# Patient Record
Sex: Female | Born: 1995 | Race: White | Hispanic: No | Marital: Single | Smoking: Current every day smoker
Health system: Southern US, Community
[De-identification: ages and names within clinical notes are randomized; demographics above are authoritative.]

## PROBLEM LIST (undated history)

## (undated) ENCOUNTER — Inpatient Hospital Stay (HOSPITAL_COMMUNITY): Payer: Self-pay

## (undated) DIAGNOSIS — F419 Anxiety disorder, unspecified: Secondary | ICD-10-CM

## (undated) DIAGNOSIS — J45909 Unspecified asthma, uncomplicated: Secondary | ICD-10-CM

## (undated) HISTORY — PX: NO PAST SURGERIES: SHX2092

---
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
## 2016-06-18 LAB — OB RESULTS CONSOLE HEPATITIS B SURFACE ANTIGEN: HEP B S AG: NEGATIVE

## 2016-07-09 ENCOUNTER — Ambulatory Visit (INDEPENDENT_AMBULATORY_CARE_PROVIDER_SITE_OTHER): Payer: Medicaid Other | Admitting: Obstetrics & Gynecology

## 2016-07-09 ENCOUNTER — Other Ambulatory Visit (HOSPITAL_COMMUNITY)
Admission: RE | Admit: 2016-07-09 | Discharge: 2016-07-09 | Disposition: A | Discharge status: Home / Self Care | Payer: Medicaid Other | Source: Ambulatory Visit | Attending: Obstetrics & Gynecology | Admitting: Obstetrics & Gynecology

## 2016-07-09 ENCOUNTER — Encounter: Payer: Self-pay | Admitting: Obstetrics & Gynecology

## 2016-07-09 DIAGNOSIS — Z3490 Encounter for supervision of normal pregnancy, unspecified, unspecified trimester: Secondary | ICD-10-CM | POA: Insufficient documentation

## 2016-07-09 DIAGNOSIS — Z113 Encounter for screening for infections with a predominantly sexual mode of transmission: Secondary | ICD-10-CM | POA: Insufficient documentation

## 2016-07-09 DIAGNOSIS — Z01419 Encounter for gynecological examination (general) (routine) without abnormal findings: Secondary | ICD-10-CM | POA: Insufficient documentation

## 2016-07-09 DIAGNOSIS — Z3400 Encounter for supervision of normal first pregnancy, unspecified trimester: Secondary | ICD-10-CM

## 2016-07-09 DIAGNOSIS — Z3402 Encounter for supervision of normal first pregnancy, second trimester: Secondary | ICD-10-CM | POA: Diagnosis not present

## 2016-07-09 DIAGNOSIS — Z3689 Encounter for other specified antenatal screening: Secondary | ICD-10-CM | POA: Diagnosis not present

## 2016-07-09 NOTE — Addendum Note (Signed)
Addended by: Natale MilchSTALLING, BRITTANY D on: 07/09/2016 05:18 PM   Modules accepted: Orders

## 2016-07-09 NOTE — Progress Notes (Signed)
  [place]ive[place]    Madison Wilkinson is a G1P0 5735w2d being seen today for her first obstetrical visit.  Her obstetrical history is significant for first pregnancy. Patient does intend to breast feed. Pregnancy history fully reviewed.  Patient reports nausea.  Vitals:   07/09/16 1549 07/09/16 1553  BP: 115/78   Pulse: 99   Temp: 98 F (36.7 C)   Weight: 202 lb 12.8 oz (92 kg)   Height:  5\' 3"  (1.6 m)    HISTORY: OB History  Gravida Para Term Preterm AB Living  1            SAB TAB Ectopic Multiple Live Births               # Outcome Date GA Lbr Len/2nd Weight Sex Delivery Anes PTL Lv  1 Current              History reviewed. No pertinent past medical history. History reviewed. No pertinent surgical history. Family History  Problem Relation Age of Onset  . Cancer Paternal Grandfather   . Diabetes Paternal Grandfather   . Heart attack Paternal Grandfather      Exam    Uterus:     Pelvic Exam:    Perineum: No Hemorrhoids   Vulva: normal   Vagina:  normal mucosa   pH:     Cervix: no cervical motion tenderness and no lesions   Adnexa: normal adnexa   Bony Pelvis: average  System: Breast:  normal appearance, no masses or tenderness   Skin: normal coloration and turgor, no rashes    Neurologic: oriented, normal mood   Extremities: normal strength, tone, and muscle mass   HEENT thyroid without masses   Mouth/Teeth mucous membranes moist, pharynx normal without lesions and dental hygiene good   Neck supple   Cardiovascular: regular rate and rhythm, no murmurs or gallops   Respiratory:  appears well, vitals normal, no respiratory distress, acyanotic, normal RR, neck free of mass or lymphadenopathy, chest clear, no wheezing, crepitations, rhonchi, normal symmetric air entry   Abdomen: soft, non-tender; bowel sounds normal; no masses,  no organomegaly   Urinary: urethral meatus normal      Assessment:    Pregnancy: G1P0 Patient Active  Problem List   Diagnosis Date Noted  . Supervision of normal pregnancy, antepartum 07/09/2016        Plan:     Initial labs drawn. Prenatal vitamins. Problem list reviewed and updated. Genetic Screening discussed dating [place] scheduled, S<D, bedside us by me today CRL 4.76 cm + FHM, c/w 12 weeks.  Ultrasound discussed; fetal survey: 18-20 weeks.  Follow up in 4 weeks. 50% of 30 min visit spent on counseling and coordination of care.  [facility] [facility]   Shravya Wickwire 07/09/2016

## 2016-07-09 NOTE — Progress Notes (Signed)
Patient states that she is feeling good today.

## 2016-07-11 LAB — CYTOLOGY - PAP
CHLAMYDIA, DNA PROBE: NEGATIVE
Diagnosis: NEGATIVE
NEISSERIA GONORRHEA: NEGATIVE

## 2016-07-16 ENCOUNTER — Ambulatory Visit (INDEPENDENT_AMBULATORY_CARE_PROVIDER_SITE_OTHER): Payer: Medicaid Other

## 2016-07-16 DIAGNOSIS — Z3400 Encounter for supervision of normal first pregnancy, unspecified trimester: Secondary | ICD-10-CM

## 2016-07-16 DIAGNOSIS — Z3402 Encounter for supervision of normal first pregnancy, second trimester: Secondary | ICD-10-CM | POA: Diagnosis not present

## 2016-07-23 ENCOUNTER — Encounter (HOSPITAL_COMMUNITY): Payer: Self-pay | Admitting: Emergency Medicine

## 2016-07-23 ENCOUNTER — Emergency Department (HOSPITAL_COMMUNITY)
Admission: EM | Admit: 2016-07-23 | Discharge: 2016-07-23 | Disposition: A | Discharge status: Home / Self Care | Payer: Medicaid Other | Attending: Emergency Medicine | Admitting: Emergency Medicine

## 2016-07-23 ENCOUNTER — Telehealth: Payer: Self-pay | Admitting: *Deleted

## 2016-07-23 DIAGNOSIS — O99611 Diseases of the digestive system complicating pregnancy, first trimester: Secondary | ICD-10-CM | POA: Insufficient documentation

## 2016-07-23 DIAGNOSIS — Z87891 Personal history of nicotine dependence: Secondary | ICD-10-CM | POA: Insufficient documentation

## 2016-07-23 DIAGNOSIS — Z3A13 13 weeks gestation of pregnancy: Secondary | ICD-10-CM | POA: Diagnosis not present

## 2016-07-23 DIAGNOSIS — O0281 Inappropriate change in quantitative human chorionic gonadotropin (hCG) in early pregnancy: Secondary | ICD-10-CM | POA: Insufficient documentation

## 2016-07-23 DIAGNOSIS — K5900 Constipation, unspecified: Secondary | ICD-10-CM | POA: Diagnosis not present

## 2016-07-23 LAB — COMPREHENSIVE METABOLIC PANEL
ALT: 15 U/L (ref 14–54)
ANION GAP: 7 (ref 5–15)
AST: 17 U/L (ref 15–41)
Albumin: 3.8 g/dL (ref 3.5–5.0)
Alkaline Phosphatase: 49 U/L (ref 38–126)
BILIRUBIN TOTAL: 0.4 mg/dL (ref 0.3–1.2)
BUN: <5 mg/dL — ABNORMAL LOW (ref 6–20)
CO2: 22 mmol/L (ref 22–32)
Calcium: 9.2 mg/dL (ref 8.9–10.3)
Chloride: 106 mmol/L (ref 101–111)
Creatinine, Ser: 0.59 mg/dL (ref 0.44–1.00)
GFR calc Af Amer: >60 mL/min (ref >60)
Glucose, Bld: 106 mg/dL — ABNORMAL HIGH (ref 65–99)
POTASSIUM: 3.6 mmol/L (ref 3.5–5.1)
Sodium: 135 mmol/L (ref 135–145)
TOTAL PROTEIN: 6.8 g/dL (ref 6.5–8.1)

## 2016-07-23 LAB — URINE MICROSCOPIC-ADD ON: RBC / HPF: NONE SEEN RBC/hpf (ref 0–5)

## 2016-07-23 LAB — CBC
HEMATOCRIT: 38.6 % (ref 36.0–46.0)
HEMOGLOBIN: 13.1 g/dL (ref 12.0–15.0)
MCH: 29.1 pg (ref 26.0–34.0)
MCHC: 33.9 g/dL (ref 30.0–36.0)
MCV: 85.8 fL (ref 78.0–100.0)
Platelets: 249 10*3/uL (ref 150–400)
RBC: 4.5 MIL/uL (ref 3.87–5.11)
RDW: 12 % (ref 11.5–15.5)
WBC: 8.5 10*3/uL (ref 4.0–10.5)

## 2016-07-23 LAB — URINALYSIS, ROUTINE W REFLEX MICROSCOPIC
BILIRUBIN URINE: NEGATIVE
Glucose, UA: NEGATIVE mg/dL
Hgb urine dipstick: NEGATIVE
Ketones, ur: NEGATIVE mg/dL
NITRITE: NEGATIVE
PROTEIN: NEGATIVE mg/dL
SPECIFIC GRAVITY, URINE: 1.004 — AB (ref 1.005–1.030)
pH: 6 (ref 5.0–8.0)

## 2016-07-23 LAB — HCG, QUANTITATIVE, PREGNANCY: HCG, BETA CHAIN, QUANT, S: 60939 m[IU]/mL — AB (ref <5)

## 2016-07-23 LAB — LIPASE, BLOOD: Lipase: 20 U/L (ref 11–51)

## 2016-07-23 MED ORDER — SENNOSIDES-DOCUSATE SODIUM 8.6-50 MG PO TABS: 1.0000 | ORAL_TABLET | Freq: Every evening | ORAL | 0 refills | Status: DC | PRN

## 2016-07-23 NOTE — ED Triage Notes (Signed)
Pt reports being [redacted] weeks pregnant and waking up at 3am while sharp "all over abd pain" that started and stopped. Pt having no pain at this time. First pregnancy. Pt denies any vaginal bleeding or discharge.

## 2016-07-23 NOTE — ED Notes (Signed)
Pt unable to sign for discharge papers due to computer not working.  Pt verbalizes understanding

## 2016-07-23 NOTE — Telephone Encounter (Signed)
Pt called to office stating that she is having a lot of stomach pain. Would like to know if she needs  appt [facility].  Attempt to return call.  LM on VM making pt aware that [facility] if she feels her pain needs urgent attention. If not, pt advised to call office in order to get more information regarding problem.

## 2016-07-23 NOTE — ED Provider Notes (Signed)
[facility]-EMERGENCY DEPT Provider Note   CSN: [id number] Arrival date & time: 07/23/16  1105     History   Chief Complaint Chief Complaint  Patient presents with  . Abdominal Pain    HPI Madison Wilkinson is a 20 y.o. female.  Pt said that she is [redacted] weeks pregnant and woke up around 0300 with severe, diffuse abdominal pain.  Pt said that her pain is gone now.  She has been constipated.  She has not had a bm in 4 days.  She took some prune juice last night, but that has not helped.  [facility]  She denies any vaginal bleeding or d/c.      History reviewed. No pertinent past medical history.  Patient Active Problem List   Diagnosis Date Noted  . Supervision of normal pregnancy, antepartum 07/09/2016    History reviewed. No pertinent surgical history.  OB History    Gravida Para Term Preterm AB Living   1             SAB TAB Ectopic Multiple Live Births                   Home Medications    Prior to Admission medications   Medication Sig Start Date End Date Taking? Authorizing Provider  senna-docusate (SENOKOT-S) 8.6-50 MG tablet Take 1 tablet by mouth at bedtime as needed for mild constipation. 07/23/16   Jacalyn LefevreJulie Aleksei Goodlin, MD    Family History Family History  Problem Relation Age of Onset  . Cancer Paternal Grandfather   . Diabetes Paternal Grandfather   . Heart attack Paternal Grandfather     Social History Social History  Substance Use Topics  . Smoking status: Former Smoker    Packs/day: 1.00  . Smokeless tobacco: Never Used  . Alcohol use No     Allergies   Patient has no known allergies.   Review of Systems Review of Systems  Gastrointestinal: Positive for abdominal pain and constipation.  All other systems reviewed and are negative.    Physical Exam Updated Vital Signs BP 120/72   Pulse 91   Temp 97.9 F (36.6 C) (Oral)   Resp 18   Ht 5\' 3"  (1.6 m)   Wt 202 lb (91.6 kg)   LMP 03/24/2016   SpO2 99%   BMI  35.78 kg/m   Physical Exam  Constitutional: She appears well-developed and well-nourished.  HENT:  Head: Normocephalic and atraumatic.  Right Ear: External ear normal.  Left Ear: External ear normal.  Nose: Nose normal.  Mouth/Throat: Oropharynx is clear and moist.  Eyes: Conjunctivae and EOM are normal. Pupils are equal, round, and reactive to light.  Neck: Normal range of motion. Neck supple.  Cardiovascular: Normal rate, regular rhythm, normal heart sounds and intact distal pulses.   Pulmonary/Chest: Effort normal and breath sounds normal.  Abdominal: Soft. Bowel sounds are decreased. There is no tenderness.  Musculoskeletal: Normal range of motion.  Neurological: She is alert.  Skin: Skin is warm.  Psychiatric: She has a normal mood and affect. Her behavior is normal. Judgment and thought content normal.  Nursing note and vitals reviewed.    ED Treatments / Results  Labs (all labs ordered are listed, but only abnormal results are displayed) Labs Reviewed  COMPREHENSIVE METABOLIC PANEL - Abnormal; Notable for the following:       Result Value   Glucose, Bld 106 (*)    BUN <5 (*)  All other components within normal limits  HCG, QUANTITATIVE, PREGNANCY - Abnormal; Notable for the following:    hCG, Beta Chain, Quant, S 60,939 (*)    All other components within normal limits  URINALYSIS, ROUTINE W REFLEX MICROSCOPIC (NOT AT [facility]) - Abnormal; Notable for the following:    APPearance CLOUDY (*)    Specific Gravity, Urine 1.004 (*)    Leukocytes, UA TRACE (*)    All other components within normal limits  URINE MICROSCOPIC-ADD ON - Abnormal; Notable for the following:    Squamous Epithelial / LPF 6-30 (*)    Bacteria, UA FEW (*)    All other components within normal limits  LIPASE, BLOOD  CBC    EKG  EKG Interpretation None       Radiology No results found.  Procedures Procedures (including critical care time)  Medications Ordered in ED Medications - No  data to display   Initial Impression / Assessment and Plan / ED Course  I have reviewed the triage vital signs and the nursing notes.  Pertinent labs & imaging results that were available during my care of the patient were reviewed by me and considered in my medical decision making (see chart for details).  Clinical Course    Bedside ultrasound done:  IUP.  Good FHTs and good fetal mvmt.  Pt will be started on senna.  She knows to return if worse and to f/u with obgyn.  Final Clinical Impressions(s) / ED Diagnoses   Final diagnoses:  Constipation, unspecified constipation type  [redacted] weeks gestation of pregnancy    New Prescriptions New Prescriptions   SENNA-DOCUSATE (SENOKOT-S) 8.6-50 MG TABLET    Take 1 tablet by mouth at bedtime as needed for mild constipation.     Jacalyn LefevreJulie Jesalyn Finazzo, MD 07/23/16 [phone]

## 2016-08-06 ENCOUNTER — Ambulatory Visit (INDEPENDENT_AMBULATORY_CARE_PROVIDER_SITE_OTHER): Payer: Medicaid Other | Admitting: Obstetrics & Gynecology

## 2016-08-06 DIAGNOSIS — Z3402 Encounter for supervision of normal first pregnancy, second trimester: Secondary | ICD-10-CM

## 2016-08-06 DIAGNOSIS — Z3482 Encounter for supervision of other normal pregnancy, second trimester: Secondary | ICD-10-CM | POA: Diagnosis not present

## 2016-08-06 DIAGNOSIS — Z3400 Encounter for supervision of normal first pregnancy, unspecified trimester: Secondary | ICD-10-CM

## 2016-08-06 NOTE — Progress Notes (Signed)
   PRENATAL VISIT NOTE  Subjective:  Madison Wilkinson is a 20 y.o. G1P0 at 7661w0d being seen today for ongoing prenatal care.  She is currently monitored for the following issues for this low-risk pregnancy and has Supervision of normal pregnancy, antepartum on her problem list.  Patient reports nausea.  Contractions: Not present. Vag. Bleeding: None.   . Denies leaking of fluid.   The following portions of the patient's history were reviewed and updated as appropriate: allergies, current medications, past family history, past medical history, past social history, past surgical history and problem list. Problem list updated.  Objective:   Vitals:   08/06/16 1420  BP: 100/64  Pulse: 97  Temp: 98.1 F (36.7 C)  Weight: 200 lb (90.7 kg)    Fetal Status: Fetal Heart Rate (bpm): 146         General:  Alert, oriented and cooperative. Patient is in no acute distress.  Skin: Skin is warm and dry. No rash noted.   Cardiovascular: Normal heart rate noted  Respiratory: Normal respiratory effort, no problems with respiration noted  Abdomen: Soft, gravid, appropriate for gestational age. Pain/Pressure: Absent     Pelvic:  Cervical exam deferred        Extremities: Normal range of motion.  Edema: None  Mental Status: Normal mood and affect. Normal behavior. Normal judgment and thought content.   Assessment and Plan:  Pregnancy: G1P0 at 4161w0d  1. Supervision of normal first pregnancy, antepartum screening - AFP, Quad Screen - US OB Comp + 14 Wk; Future  Preterm labor symptoms and general obstetric precautions including but not limited to vaginal bleeding, contractions, leaking of fluid and fetal movement were reviewed in detail with the patient. Please refer to After Visit Summary for other counseling recommendations.  Return in about 4 weeks (around 09/03/2016). US anatomy in 3 weeks  Adam PhenixJames G Arnold, MD

## 2016-08-06 NOTE — Progress Notes (Signed)
Patient states that she feels good today and has started feeling fetal flutter movements.

## 2016-08-09 LAB — AFP, QUAD SCREEN
DIA Mom Value: 0.77
DIA VALUE (EIA): 118.41 pg/mL
DSR (By Age)    1 IN: 1121
DSR (SECOND TRIMESTER) 1 IN: 8518
GESTATIONAL AGE AFP: 16 wk
MSAFP MOM: 0.82
MSAFP: 22.5 ng/mL
MSHCG Mom: 0.88
MSHCG: 29226 m[IU]/mL
Maternal Age At EDD: 22.3 YEARS
OSB RISK: 10000
TEST RESULTS AFP: NEGATIVE
UE3 MOM: 0.9
WEIGHT: 200 [lb_av]
uE3 Value: 0.68 ng/mL

## 2016-08-22 ENCOUNTER — Ambulatory Visit (INDEPENDENT_AMBULATORY_CARE_PROVIDER_SITE_OTHER): Payer: Medicaid Other

## 2016-08-22 DIAGNOSIS — Z3400 Encounter for supervision of normal first pregnancy, unspecified trimester: Secondary | ICD-10-CM

## 2016-08-22 DIAGNOSIS — Z3402 Encounter for supervision of normal first pregnancy, second trimester: Secondary | ICD-10-CM

## 2016-09-03 ENCOUNTER — Ambulatory Visit (INDEPENDENT_AMBULATORY_CARE_PROVIDER_SITE_OTHER): Payer: Medicaid Other | Admitting: Obstetrics and Gynecology

## 2016-09-03 DIAGNOSIS — Z3490 Encounter for supervision of normal pregnancy, unspecified, unspecified trimester: Secondary | ICD-10-CM

## 2016-09-03 DIAGNOSIS — Z3492 Encounter for supervision of normal pregnancy, unspecified, second trimester: Secondary | ICD-10-CM

## 2016-09-03 NOTE — Progress Notes (Signed)
   PRENATAL VISIT NOTE  Subjective:  Madison Wilkinson is a 20 y.o. G1P0 at 5272w0d being seen today for ongoing prenatal care.  She is currently monitored for the following issues for this low-risk pregnancy and has Supervision of normal pregnancy, antepartum on her problem list.  Patient reports no complaints.  Contractions: Not present. Vag. Bleeding: None.   . Denies leaking of fluid.   The following portions of the patient's history were reviewed and updated as appropriate: allergies, current medications, past family history, past medical history, past social history, past surgical history and problem list. Problem list updated.  Objective:   Vitals:   09/03/16 1414  BP: 131/74  Pulse: (!) 108  Weight: 200 lb (90.7 kg)    Fetal Status: Fetal Heart Rate (bpm): 146 Fundal Height: 20 cm       General:  Alert, oriented and cooperative. Patient is in no acute distress.  Skin: Skin is warm and dry. No rash noted.   Cardiovascular: Normal heart rate noted  Respiratory: Normal respiratory effort, no problems with respiration noted  Abdomen: Soft, gravid, appropriate for gestational age. Pain/Pressure: Absent     Pelvic:  Cervical exam deferred        Extremities: Normal range of motion.  Edema: None  Mental Status: Normal mood and affect. Normal behavior. Normal judgment and thought content.   Assessment and Plan:  Pregnancy: G1P0 at 5972w0d  1. Encounter for supervision of normal pregnancy, antepartum, unspecified gravidity Patient is doing well without complaints Follow up ultrasound to complete anatomy ordered - US MFM OB COMP + 14 WK; Future  General obstetric precautions including but not limited to vaginal bleeding, contractions, leaking of fluid and fetal movement were reviewed in detail with the patient. Please refer to After Visit Summary for other counseling recommendations.  Return in about 4 weeks (around 10/01/2016) for rob.   Catalina AntiguaPeggy Jayvion Stefanski, MD

## 2016-09-04 ENCOUNTER — Encounter (HOSPITAL_COMMUNITY): Payer: Self-pay | Admitting: Obstetrics and Gynecology

## 2016-09-09 NOTE — L&D Delivery Note (Signed)
21 y.o. G1P0 at 4645w1d delivered a viable female infant in cephalic, LOA position. Loose nuchal cord, easily reduced.  Anterior shoulder delivered with ease. 60 sec delayed cord clamping. Cord clamped x2 and cut. Placenta delivered spontaneously intact, with 3VC. Fundus firm on exam with massage and pitocin. Good hemostasis noted.  Anesthesia: Epidural Laceration: 2nd degree perineal, bilateral labial Suture: 2.0 Vicryl Good hemostasis noted. EBL: 250cc  Mom and baby recovering in LDR.    Apgars: APGAR (1 MIN):  8 APGAR (5 MINS):  9  Weight: Pending skin to skin  Sponge and instrument count were correct x2. Placenta sent to L&D.  Howard PouchLauren Feng, MD PGY-1 Family Medicine 01/29/2017, 4:19 PM  OB FELLOW DELIVERY ATTESTATION  I was gloved and present for the delivery in its entirety, and I agree with the above resident's note.    Ernestina PennaNicholas Chelsa Stout, MD 4:26 PM

## 2016-09-16 ENCOUNTER — Ambulatory Visit (HOSPITAL_COMMUNITY)
Admission: RE | Admit: 2016-09-16 | Discharge: 2016-09-16 | Disposition: A | Discharge status: Home / Self Care | Payer: Medicaid Other | Source: Ambulatory Visit | Attending: Obstetrics and Gynecology | Admitting: Obstetrics and Gynecology

## 2016-09-16 DIAGNOSIS — Z363 Encounter for antenatal screening for malformations: Secondary | ICD-10-CM | POA: Insufficient documentation

## 2016-09-16 DIAGNOSIS — Z3A21 21 weeks gestation of pregnancy: Secondary | ICD-10-CM | POA: Insufficient documentation

## 2016-09-16 DIAGNOSIS — Z3490 Encounter for supervision of normal pregnancy, unspecified, unspecified trimester: Secondary | ICD-10-CM

## 2016-09-30 ENCOUNTER — Ambulatory Visit (INDEPENDENT_AMBULATORY_CARE_PROVIDER_SITE_OTHER): Payer: Medicaid Other | Admitting: Obstetrics and Gynecology

## 2016-09-30 VITALS — BP 114/74 | HR 98 | Wt 202.0 lb

## 2016-09-30 DIAGNOSIS — Z3402 Encounter for supervision of normal first pregnancy, second trimester: Secondary | ICD-10-CM

## 2016-09-30 DIAGNOSIS — Z3400 Encounter for supervision of normal first pregnancy, unspecified trimester: Secondary | ICD-10-CM

## 2016-09-30 NOTE — Patient Instructions (Signed)
Second Trimester of Pregnancy The second trimester is from week 13 through week 28 (months 4 through 6). The second trimester is often a time when you feel your best. Your body has also adjusted to being pregnant, and you begin to feel better physically. Usually, morning sickness has lessened or quit completely, you may have more energy, and you may have an increase in appetite. The second trimester is also a time when the fetus is growing rapidly. At the end of the sixth month, the fetus is about 9 inches long and weighs about 1 pounds. You will likely begin to feel the baby move (quickening) between 18 and 20 weeks of the pregnancy. Body changes during your second trimester Your body continues to go through many changes during your second trimester. The changes vary from woman to woman.  Your weight will continue to increase. You will notice your lower abdomen bulging out.  You may begin to get stretch marks on your hips, abdomen, and breasts.  You may develop headaches that can be relieved by medicines. The medicines should be approved by your health care provider.  You may urinate more often because the fetus is pressing on your bladder.  You may develop or continue to have heartburn as a result of your pregnancy.  You may develop constipation because certain hormones are causing the muscles that push waste through your intestines to slow down.  You may develop hemorrhoids or swollen, bulging veins (varicose veins).  You may have back pain. This is caused by:  Weight gain.  Pregnancy hormones that are relaxing the joints in your pelvis.  A shift in weight and the muscles that support your balance.  Your breasts will continue to grow and they will continue to become tender.  Your gums may bleed and may be sensitive to brushing and flossing.  Dark spots or blotches (chloasma, mask of pregnancy) may develop on your face. This will likely fade after the baby is born.  A dark line  from your belly button to the pubic area (linea nigra) may appear. This will likely fade after the baby is born.  You may have changes in your hair. These can include thickening of your hair, rapid growth, and changes in texture. Some women also have hair loss during or after pregnancy, or hair that feels dry or thin. Your hair will most likely return to normal after your baby is born. What to expect at prenatal visits During a routine prenatal visit:  You will be weighed to make sure you and the fetus are growing normally.  Your blood pressure will be taken.  Your abdomen will be measured to track your baby's growth.  The fetal heartbeat will be listened to.  Any test results from the previous visit will be discussed. Your health care provider may ask you:  How you are feeling.  If you are feeling the baby move.  If you have had any abnormal symptoms, such as leaking fluid, bleeding, severe headaches, or abdominal cramping.  If you are using any tobacco products, including cigarettes, chewing tobacco, and electronic cigarettes.  If you have any questions. Other tests that may be performed during your second trimester include:  Blood tests that check for:  Low iron levels (anemia).  Gestational diabetes (between 24 and 28 weeks).  Rh antibodies. This is to check for a protein on red blood cells (Rh factor).  Urine tests to check for infections, diabetes, or protein in the urine.  An ultrasound to   confirm the proper growth and development of the baby.  An amniocentesis to check for possible genetic problems.  Fetal screens for spina bifida and Down syndrome.  HIV (human immunodeficiency virus) testing. Routine prenatal testing includes screening for HIV, unless you choose not to have this test. Follow these instructions at home: Eating and drinking  Continue to eat regular, healthy meals.  Avoid raw meat, uncooked cheese, cat litter boxes, and soil used by cats. These  carry germs that can cause birth defects in the baby.  Take your prenatal vitamins.  Take 1500-2000 mg of calcium daily starting at the 20th week of pregnancy until you deliver your baby.  If you develop constipation:  Take over-the-counter or prescription medicines.  Drink enough fluid to keep your urine clear or pale yellow.  Eat foods that are high in fiber, such as fresh fruits and vegetables, whole grains, and beans.  Limit foods that are high in fat and processed sugars, such as fried and sweet foods. Activity  Exercise only as directed by your health care provider. Experiencing uterine cramps is a good sign to stop exercising.  Avoid heavy lifting, wear low heel shoes, and practice good posture.  Wear your seat belt at all times when driving.  Rest with your legs elevated if you have leg cramps or low back pain.  Wear a good support bra for breast tenderness.  Do not use hot tubs, steam rooms, or saunas. Lifestyle  Avoid all smoking, herbs, alcohol, and unprescribed drugs. These chemicals affect the formation and growth of the baby.  Do not use any products that contain nicotine or tobacco, such as cigarettes and e-cigarettes. If you need help quitting, ask your health care provider.  A sexual relationship may be continued unless your health care provider directs you otherwise. General instructions  Follow your health care provider's instructions regarding medicine use. There are medicines that are either safe or unsafe to take during pregnancy.  Take warm sitz baths to soothe any pain or discomfort caused by hemorrhoids. Use hemorrhoid cream if your health care provider approves.  If you develop varicose veins, wear support hose. Elevate your feet for 15 minutes, 3-4 times a day. Limit salt in your diet.  Visit your dentist if you have not gone yet during your pregnancy. Use a soft toothbrush to brush your teeth and be gentle when you floss.  Keep all follow-up  prenatal visits as told by your health care provider. This is important. Contact a health care provider if:  You have dizziness.  You have mild pelvic cramps, pelvic pressure, or nagging pain in the abdominal area.  You have persistent nausea, vomiting, or diarrhea.  You have a bad smelling vaginal discharge.  You have pain with urination. Get help right away if:  You have a fever.  You are leaking fluid from your vagina.  You have spotting or bleeding from your vagina.  You have severe abdominal cramping or pain.  You have rapid weight gain or weight loss.  You have shortness of breath with chest pain.  You notice sudden or extreme swelling of your face, hands, ankles, feet, or legs.  You have not felt your baby move in over an hour.  You have severe headaches that do not go away with medicine.  You have vision changes. Summary  The second trimester is from week 13 through week 28 (months 4 through 6). It is also a time when the fetus is growing rapidly.  Your body goes   through many changes during pregnancy. The changes vary from woman to woman.  Avoid all smoking, herbs, alcohol, and unprescribed drugs. These chemicals affect the formation and growth your baby.  Do not use any tobacco products, such as cigarettes, chewing tobacco, and e-cigarettes. If you need help quitting, ask your health care provider.  Contact your health care provider if you have any questions. Keep all prenatal visits as told by your health care provider. This is important. This information is not intended to replace advice given to you by your health care provider. Make sure you discuss any questions you have with your health care provider. Document Released: 08/20/2001 Document Revised: 02/01/2016 Document Reviewed: 10/27/2012 Elsevier Interactive Patient Education  2017 [employer] Inc.  

## 2016-09-30 NOTE — Progress Notes (Signed)
Subjective:  Madison Wilkinson is a 21 y.o. G1P0 at 3572w6d being seen today for ongoing prenatal care.  She is currently monitored for the following issues for this low-risk pregnancy and has Supervision of normal pregnancy, antepartum on her problem list.  Patient reports small skin lesion which look like bug bites.  Contractions: Not present. Vag. Bleeding: None.   . Denies leaking of fluid.   The following portions of the patient's history were reviewed and updated as appropriate: allergies, current medications, past family history, past medical history, past social history, past surgical history and problem list. Problem list updated.  Objective:   Vitals:   09/30/16 1337  BP: 114/74  Pulse: 98  Weight: 202 lb (91.6 kg)    Fetal Status: Fetal Heart Rate (bpm): 143         General:  Alert, oriented and cooperative. Patient is in no acute distress.  Skin: Skin is warm and dry. No rash noted.   Cardiovascular: Normal heart rate noted  Respiratory: Normal respiratory effort, no problems with respiration noted  Abdomen: Soft, gravid, appropriate for gestational age. Pain/Pressure: Present     Pelvic:  Cervical exam deferred        Extremities: Normal range of motion.  Edema: Trace  Mental Status: Normal mood and affect. Normal behavior. Normal judgment and thought content.   Urinalysis:      Assessment and Plan:  Pregnancy: G1P0 at 3372w6d  1. Supervision of normal first pregnancy, antepartum Tx for bug bites reviewed - US MFM OB FOLLOW UP; Future  Preterm labor symptoms and general obstetric precautions including but not limited to vaginal bleeding, contractions, leaking of fluid and fetal movement were reviewed in detail with the patient. Please refer to After Visit Summary for other counseling recommendations.  Return in about 4 weeks (around 10/28/2016).   Hermina StaggersMichael L Nathali Vent, MD

## 2016-10-17 ENCOUNTER — Ambulatory Visit (HOSPITAL_COMMUNITY)
Admission: RE | Admit: 2016-10-17 | Discharge: 2016-10-17 | Disposition: A | Discharge status: Home / Self Care | Payer: Medicaid Other | Source: Ambulatory Visit | Attending: Obstetrics and Gynecology | Admitting: Obstetrics and Gynecology

## 2016-10-17 DIAGNOSIS — Z3A26 26 weeks gestation of pregnancy: Secondary | ICD-10-CM | POA: Insufficient documentation

## 2016-10-17 DIAGNOSIS — Z362 Encounter for other antenatal screening follow-up: Secondary | ICD-10-CM | POA: Insufficient documentation

## 2016-10-17 DIAGNOSIS — Z3400 Encounter for supervision of normal first pregnancy, unspecified trimester: Secondary | ICD-10-CM | POA: Diagnosis not present

## 2016-10-21 ENCOUNTER — Other Ambulatory Visit: Payer: Self-pay

## 2016-10-21 DIAGNOSIS — Z3400 Encounter for supervision of normal first pregnancy, unspecified trimester: Secondary | ICD-10-CM

## 2016-10-28 ENCOUNTER — Other Ambulatory Visit: Payer: Medicaid Other

## 2016-10-28 ENCOUNTER — Ambulatory Visit (INDEPENDENT_AMBULATORY_CARE_PROVIDER_SITE_OTHER): Payer: Medicaid Other | Admitting: Obstetrics and Gynecology

## 2016-10-28 VITALS — BP 120/77 | HR 98 | Wt 202.6 lb

## 2016-10-28 DIAGNOSIS — Z3492 Encounter for supervision of normal pregnancy, unspecified, second trimester: Secondary | ICD-10-CM

## 2016-10-28 DIAGNOSIS — Z3490 Encounter for supervision of normal pregnancy, unspecified, unspecified trimester: Secondary | ICD-10-CM

## 2016-10-28 NOTE — Progress Notes (Signed)
2 gtt today. Pt wishes to defer Tdap until NV

## 2016-10-28 NOTE — Progress Notes (Signed)
   PRENATAL VISIT NOTE  Subjective:  Madison Wilkinson is a 21 y.o. G1P0 at [redacted]w[redacted]d being seen today for ongoing prenatal care.  She is currently monitored for the following issues for this low-risk pregnancy and has Supervision of normal pregnancy, antepartum on her problem list.  Patient reports no complaints.  Contractions: Not present. Vag. Bleeding: None.   . Denies leaking of fluid.   The following portions of the patient's history were reviewed and updated as appropriate: allergies, current medications, past family history, past medical history, past social history, past surgical history and problem list. Problem list updated.  Objective:   Vitals:   10/28/16 0901  BP: 120/77  Pulse: 98  Weight: 202 lb 9.6 oz (91.9 kg)    Fetal Status: Fetal Heart Rate (bpm): 142 Fundal Height: 28 cm       General:  Alert, oriented and cooperative. Patient is in no acute distress.  Skin: Skin is warm and dry. No rash noted.   Cardiovascular: Normal heart rate noted  Respiratory: Normal respiratory effort, no problems with respiration noted  Abdomen: Soft, gravid, appropriate for gestational age. Pain/Pressure: Present     Pelvic:  Cervical exam deferred        Extremities: Normal range of motion.  Edema: None  Mental Status: Normal mood and affect. Normal behavior. Normal judgment and thought content.   Assessment and Plan:  Pregnancy: G1P0 at [redacted]w[redacted]d  1. Encounter for supervision of normal pregnancy, antepartum, unspecified gravidity Patient is doing well Third trimester labs today Patient opted to defer Tdap at next visit Reviewed ultrasound results with patient. Patient has had 3 ultrasound since December and only structure with limited views are lips. - CBC - HIV antibody (with reflex) - RPR - Glucose Tolerance, 2 Hours w/1 Hour  Preterm labor symptoms and general obstetric precautions including but not limited to vaginal bleeding, contractions, leaking of fluid and fetal movement were  reviewed in detail with the patient. Please refer to After Visit Summary for other counseling recommendations.  No Follow-up on file.   Catalina AntiguaPeggy Dawson Albers, MD

## 2016-10-29 LAB — CBC
HEMATOCRIT: 36.5 % (ref 34.0–46.6)
Hemoglobin: 12 g/dL (ref 11.1–15.9)
MCH: 29.1 pg (ref 26.6–33.0)
MCHC: 32.9 g/dL (ref 31.5–35.7)
MCV: 88 fL (ref 79–97)
PLATELETS: 241 10*3/uL (ref 150–379)
RBC: 4.13 x10E6/uL (ref 3.77–5.28)
RDW: 13.4 % (ref 12.3–15.4)
WBC: 9.7 10*3/uL (ref 3.4–10.8)

## 2016-10-29 LAB — GLUCOSE TOLERANCE, 2 HOURS W/ 1HR
GLUCOSE, 1 HOUR: 131 mg/dL (ref 65–179)
Glucose, 2 hour: 86 mg/dL (ref 65–152)
Glucose, Fasting: 81 mg/dL (ref 65–91)

## 2016-10-29 LAB — RPR: RPR Ser Ql: NONREACTIVE

## 2016-10-29 LAB — HIV ANTIBODY (ROUTINE TESTING W REFLEX): HIV Screen 4th Generation wRfx: NONREACTIVE

## 2016-11-11 ENCOUNTER — Ambulatory Visit (INDEPENDENT_AMBULATORY_CARE_PROVIDER_SITE_OTHER): Payer: Medicaid Other | Admitting: Obstetrics and Gynecology

## 2016-11-11 DIAGNOSIS — Z3403 Encounter for supervision of normal first pregnancy, third trimester: Secondary | ICD-10-CM

## 2016-11-11 DIAGNOSIS — Z23 Encounter for immunization: Secondary | ICD-10-CM | POA: Diagnosis not present

## 2016-11-11 DIAGNOSIS — Z3490 Encounter for supervision of normal pregnancy, unspecified, unspecified trimester: Secondary | ICD-10-CM

## 2016-11-11 NOTE — Progress Notes (Signed)
Prenatal Visit Note Date: 11/11/2016 Clinic: [facility]  Subjective:  [name] is a 21 y.o. G1P0 at 5150w6d being seen today for ongoing prenatal care.  She is currently monitored for the following issues for this low-risk pregnancy and has Supervision of normal pregnancy, antepartum on her problem list.  Patient reports no complaints.   Contractions: Not present. Vag. Bleeding: None.  Movement: Present. Denies leaking of fluid.   The following portions of the patient's history were reviewed and updated as appropriate: allergies, current medications, past family history, past medical history, past social history, past surgical history and problem list. Problem list updated.  Objective:   Vitals:   11/11/16 1340  BP: 111/74  Pulse: 98  Weight: 204 lb (92.5 kg)    Fetal Status: Fetal Heart Rate (bpm): 145 Fundal Height: 29 cm Movement: Present     General:  Alert, oriented and cooperative. Patient is in no acute distress.  Skin: Skin is warm and dry. No rash noted.   Cardiovascular: Normal heart rate noted  Respiratory: Normal respiratory effort, no problems with respiration noted  Abdomen: Soft, gravid, appropriate for gestational age. Pain/Pressure: Present     Pelvic:  Cervical exam deferred        Extremities: Normal range of motion.     Mental Status: Normal mood and affect. Normal behavior. Normal judgment and thought content.   Urinalysis:      Assessment and Plan:  Pregnancy: G1P0 at 6950w6d  1. Encounter for supervision of normal pregnancy, antepartum, unspecified gravidity Neg 28wk labs. Pt told to keep eye on weight - Tdap vaccine greater than or equal to 7yo IM  Preterm labor symptoms and general obstetric precautions including but not limited to vaginal bleeding, contractions, leaking of fluid and fetal movement were reviewed in detail with the patient. Please refer to After Visit Summary for other counseling recommendations.  Return in  about 2 weeks (around 11/25/2016) for 2-3wk rob.   [place] Bingharlie Ermias Tomeo, MD

## 2016-11-18 ENCOUNTER — Ambulatory Visit (HOSPITAL_COMMUNITY)
Admission: RE | Admit: 2016-11-18 | Discharge: 2016-11-18 | Disposition: A | Discharge status: Home / Self Care | Payer: Medicaid Other | Source: Ambulatory Visit | Attending: Obstetrics and Gynecology | Admitting: Obstetrics and Gynecology

## 2016-11-18 ENCOUNTER — Ambulatory Visit (HOSPITAL_COMMUNITY): Payer: Medicaid Other

## 2016-11-18 DIAGNOSIS — Z3400 Encounter for supervision of normal first pregnancy, unspecified trimester: Secondary | ICD-10-CM

## 2016-11-18 DIAGNOSIS — Z362 Encounter for other antenatal screening follow-up: Secondary | ICD-10-CM | POA: Diagnosis not present

## 2016-11-18 DIAGNOSIS — Z3A30 30 weeks gestation of pregnancy: Secondary | ICD-10-CM | POA: Insufficient documentation

## 2016-11-26 ENCOUNTER — Ambulatory Visit (INDEPENDENT_AMBULATORY_CARE_PROVIDER_SITE_OTHER): Payer: Medicaid Other | Admitting: Obstetrics & Gynecology

## 2016-11-26 VITALS — BP 125/77 | HR 99 | Wt 205.4 lb

## 2016-11-26 DIAGNOSIS — Z3403 Encounter for supervision of normal first pregnancy, third trimester: Secondary | ICD-10-CM

## 2016-11-26 DIAGNOSIS — Z3490 Encounter for supervision of normal pregnancy, unspecified, unspecified trimester: Secondary | ICD-10-CM

## 2016-11-26 LAB — POCT URINALYSIS DIPSTICK
Bilirubin, UA: NEGATIVE
Glucose, UA: 250
KETONES UA: NEGATIVE
LEUKOCYTES UA: NEGATIVE
Nitrite, UA: NEGATIVE
PH UA: 7.5 (ref 5.0–8.0)
RBC UA: NEGATIVE
Spec Grav, UA: 1.01 (ref 1.030–1.035)
Urobilinogen, UA: NEGATIVE (ref ?–2.0)

## 2016-11-26 NOTE — Progress Notes (Signed)
Patient complains of lots of pressure making it difficult to urinate. Wants to know about the Pregnancy Belt.

## 2016-11-26 NOTE — Progress Notes (Signed)
   PRENATAL VISIT NOTE  Subjective:  Madison Wilkinson is a 21 y.o. G1P0 at 3139w0d being seen today for ongoing prenatal care.  She is currently monitored for the following issues for this low-risk pregnancy and has Supervision of normal pregnancy, antepartum on her problem list.  Patient reports pelvic pressure.  Contractions: Not present. Vag. Bleeding: None.  Movement: Present. Denies leaking of fluid.   The following portions of the patient's history were reviewed and updated as appropriate: allergies, current medications, past family history, past medical history, past social history, past surgical history and problem list. Problem list updated.  Objective:   Vitals:   11/26/16 1409  BP: 125/77  Pulse: 99  Weight: 205 lb 6.4 oz (93.2 kg)    Fetal Status:   Fundal Height: 32 cm Movement: Present     General:  Alert, oriented and cooperative. Patient is in no acute distress.  Skin: Skin is warm and dry. No rash noted.   Cardiovascular: Normal heart rate noted  Respiratory: Normal respiratory effort, no problems with respiration noted  Abdomen: Soft, gravid, appropriate for gestational age. Pain/Pressure: Present     Pelvic:  Cervical exam deferred        Extremities: Normal range of motion.  Edema: None  Mental Status: Normal mood and affect. Normal behavior. Normal judgment and thought content.   Assessment and Plan:  Pregnancy: G1P0 at 3939w0d  1. Encounter for supervision of normal pregnancy, antepartum, unspecified gravidity Pregnancy support belt  Preterm labor symptoms and general obstetric precautions including but not limited to vaginal bleeding, contractions, leaking of fluid and fetal movement were reviewed in detail with the patient. Please refer to After Visit Summary for other counseling recommendations.  Return in about 2 weeks (around 12/10/2016).   Adam PhenixJames G Jeremih Dearmas, MD

## 2016-11-26 NOTE — Patient Instructions (Signed)
Third Trimester of Pregnancy The third trimester is from week 28 through week 40 (months 7 through 9). The third trimester is a time when the unborn baby (fetus) is growing rapidly. At the end of the ninth month, the fetus is about 20 inches in length and weighs 6-10 pounds. Body changes during your third trimester Your body will continue to go through many changes during pregnancy. The changes vary from woman to woman. During the third trimester:  Your weight will continue to increase. You can expect to gain 25-35 pounds (11-16 kg) by the end of the pregnancy.  You may begin to get stretch marks on your hips, abdomen, and breasts.  You may urinate more often because the fetus is moving lower into your pelvis and pressing on your bladder.  You may develop or continue to have heartburn. This is caused by increased hormones that slow down muscles in the digestive tract.  You may develop or continue to have constipation because increased hormones slow digestion and cause the muscles that push waste through your intestines to relax.  You may develop hemorrhoids. These are swollen veins (varicose veins) in the rectum that can itch or be painful.  You may develop swollen, bulging veins (varicose veins) in your legs.  You may have increased body aches in the pelvis, back, or thighs. This is due to weight gain and increased hormones that are relaxing your joints.  You may have changes in your hair. These can include thickening of your hair, rapid growth, and changes in texture. Some women also have hair loss during or after pregnancy, or hair that feels dry or thin. Your hair will most likely return to normal after your baby is born.  Your breasts will continue to grow and they will continue to become tender. A yellow fluid (colostrum) may leak from your breasts. This is the first milk you are producing for your baby.  Your belly button may stick out.  You may notice more swelling in your hands,  face, or ankles.  You may have increased tingling or numbness in your hands, arms, and legs. The skin on your belly may also feel numb.  You may feel short of breath because of your expanding uterus.  You may have more problems sleeping. This can be caused by the size of your belly, increased need to urinate, and an increase in your body's metabolism.  You may notice the fetus "dropping," or moving lower in your abdomen (lightening).  You may have increased vaginal discharge.  You may notice your joints feel loose and you may have pain around your pelvic bone.  What to expect at prenatal visits You will have prenatal exams every 2 weeks until week 36. Then you will have weekly prenatal exams. During a routine prenatal visit:  You will be weighed to make sure you and the baby are growing normally.  Your blood pressure will be taken.  Your abdomen will be measured to track your baby's growth.  The fetal heartbeat will be listened to.  Any test results from the previous visit will be discussed.  You may have a cervical check near your due date to see if your cervix has softened or thinned (effaced).  You will be tested for Group B streptococcus. This happens between 35 and 37 weeks.  Your health care provider may ask you:  What your birth plan is.  How you are feeling.  If you are feeling the baby move.  If you have had   any abnormal symptoms, such as leaking fluid, bleeding, severe headaches, or abdominal cramping.  If you are using any tobacco products, including cigarettes, chewing tobacco, and electronic cigarettes.  If you have any questions.  Other tests or screenings that may be performed during your third trimester include:  Blood tests that check for low iron levels (anemia).  Fetal testing to check the health, activity level, and growth of the fetus. Testing is done if you have certain medical conditions or if there are problems during the  pregnancy.  Nonstress test (NST). This test checks the health of your baby to make sure there are no signs of problems, such as the baby not getting enough oxygen. During this test, a belt is placed around your belly. The baby is made to move, and its heart rate is monitored during movement.  What is false labor? False labor is a condition in which you feel small, irregular tightenings of the muscles in the womb (contractions) that usually go away with rest, changing position, or drinking water. These are called Braxton Hicks contractions. Contractions may last for hours, days, or even weeks before true labor sets in. If contractions come at regular intervals, become more frequent, increase in intensity, or become painful, you should see your health care provider. What are the signs of labor?  Abdominal cramps.  Regular contractions that start at 10 minutes apart and become stronger and more frequent with time.  Contractions that start on the top of the uterus and spread down to the lower abdomen and back.  Increased pelvic pressure and dull back pain.  A watery or bloody mucus discharge that comes from the vagina.  Leaking of amniotic fluid. This is also known as your "water breaking." It could be a slow trickle or a gush. Let your health care provider know if it has a color or strange odor. If you have any of these signs, call your health care provider right away, even if it is before your due date. Follow these instructions at home: Medicines  Follow your health care provider's instructions regarding medicine use. Specific medicines may be either safe or unsafe to take during pregnancy.  Take a prenatal vitamin that contains at least 600 micrograms (mcg) of folic acid.  If you develop constipation, try taking a stool softener if your health care provider approves. Eating and drinking  Eat a balanced diet that includes fresh fruits and vegetables, whole grains, good sources of protein  such as meat, eggs, or tofu, and low-fat dairy. Your health care provider will help you determine the amount of weight gain that is right for you.  Avoid raw meat and uncooked cheese. These carry germs that can cause birth defects in the baby.  If you have low calcium intake from food, talk to your health care provider about whether you should take a daily calcium supplement.  Eat four or five small meals rather than three large meals a day.  Limit foods that are high in fat and processed sugars, such as fried and sweet foods.  To prevent constipation: ? Drink enough fluid to keep your urine clear or pale yellow. ? Eat foods that are high in fiber, such as fresh fruits and vegetables, whole grains, and beans. Activity  Exercise only as directed by your health care provider. Most women can continue their usual exercise routine during pregnancy. Try to exercise for 30 minutes at least 5 days a week. Stop exercising if you experience uterine contractions.  Avoid heavy   lifting.  Do not exercise in extreme heat or humidity, or at high altitudes.  Wear low-heel, comfortable shoes.  Practice good posture.  You may continue to have sex unless your health care provider tells you otherwise. Relieving pain and discomfort  Take frequent breaks and rest with your legs elevated if you have leg cramps or low back pain.  Take warm sitz baths to soothe any pain or discomfort caused by hemorrhoids. Use hemorrhoid cream if your health care provider approves.  Wear a good support bra to prevent discomfort from breast tenderness.  If you develop varicose veins: ? Wear support pantyhose or compression stockings as told by your healthcare provider. ? Elevate your feet for 15 minutes, 3-4 times a day. Prenatal care  Write down your questions. Take them to your prenatal visits.  Keep all your prenatal visits as told by your health care provider. This is important. Safety  Wear your seat belt at  all times when driving.  Make a list of emergency phone numbers, including numbers for family, friends, the hospital, and police and fire departments. General instructions  Avoid cat litter boxes and soil used by cats. These carry germs that can cause birth defects in the baby. If you have a cat, ask someone to clean the litter box for you.  Do not travel far distances unless it is absolutely necessary and only with the approval of your health care provider.  Do not use hot tubs, steam rooms, or saunas.  Do not drink alcohol.  Do not use any products that contain nicotine or tobacco, such as cigarettes and e-cigarettes. If you need help quitting, ask your health care provider.  Do not use any medicinal herbs or unprescribed drugs. These chemicals affect the formation and growth of the baby.  Do not douche or use tampons or scented sanitary pads.  Do not cross your legs for long periods of time.  To prepare for the arrival of your baby: ? Take prenatal classes to understand, practice, and ask questions about labor and delivery. ? Make a trial run to the hospital. ? Visit the hospital and tour the maternity area. ? Arrange for maternity or paternity leave through employers. ? Arrange for family and friends to take care of pets while you are in the hospital. ? Purchase a rear-facing car seat and make sure you know how to install it in your car. ? Pack your hospital bag. ? Prepare the baby's nursery. Make sure to remove all pillows and stuffed animals from the baby's crib to prevent suffocation.  Visit your dentist if you have not gone during your pregnancy. Use a soft toothbrush to brush your teeth and be gentle when you floss. Contact a health care provider if:  You are unsure if you are in labor or if your water has broken.  You become dizzy.  You have mild pelvic cramps, pelvic pressure, or nagging pain in your abdominal area.  You have lower back pain.  You have persistent  nausea, vomiting, or diarrhea.  You have an unusual or bad smelling vaginal discharge.  You have pain when you urinate. Get help right away if:  Your water breaks before 37 weeks.  You have regular contractions less than 5 minutes apart before 37 weeks.  You have a fever.  You are leaking fluid from your vagina.  You have spotting or bleeding from your vagina.  You have severe abdominal pain or cramping.  You have rapid weight loss or weight gain.    You have shortness of breath with chest pain.  You notice sudden or extreme swelling of your face, hands, ankles, feet, or legs.  Your baby makes fewer than 10 movements in 2 hours.  You have severe headaches that do not go away when you take medicine.  You have vision changes. Summary  The third trimester is from week 28 through week 40, months 7 through 9. The third trimester is a time when the unborn baby (fetus) is growing rapidly.  During the third trimester, your discomfort may increase as you and your baby continue to gain weight. You may have abdominal, leg, and back pain, sleeping problems, and an increased need to urinate.  During the third trimester your breasts will keep growing and they will continue to become tender. A yellow fluid (colostrum) may leak from your breasts. This is the first milk you are producing for your baby.  False labor is a condition in which you feel small, irregular tightenings of the muscles in the womb (contractions) that eventually go away. These are called Braxton Hicks contractions. Contractions may last for hours, days, or even weeks before true labor sets in.  Signs of labor can include: abdominal cramps; regular contractions that start at 10 minutes apart and become stronger and more frequent with time; watery or bloody mucus discharge that comes from the vagina; increased pelvic pressure and dull back pain; and leaking of amniotic fluid. This information is not intended to replace advice  given to you by your health care provider. Make sure you discuss any questions you have with your health care provider. Document Released: 08/20/2001 Document Revised: 02/01/2016 Document Reviewed: 10/27/2012 Elsevier Interactive Patient Education  2017 [employer]  

## 2016-12-09 ENCOUNTER — Ambulatory Visit (INDEPENDENT_AMBULATORY_CARE_PROVIDER_SITE_OTHER): Payer: Medicaid Other | Admitting: Obstetrics & Gynecology

## 2016-12-09 VITALS — BP 127/78 | HR 98 | Wt 206.0 lb

## 2016-12-09 DIAGNOSIS — Z3400 Encounter for supervision of normal first pregnancy, unspecified trimester: Secondary | ICD-10-CM

## 2016-12-09 DIAGNOSIS — Z3403 Encounter for supervision of normal first pregnancy, third trimester: Secondary | ICD-10-CM

## 2016-12-09 NOTE — Progress Notes (Signed)
   PRENATAL VISIT NOTE  Subjective:  Madison Wilkinson is a 21 y.o. G1P0 at [redacted]w[redacted]d being seen today for ongoing prenatal care.  She is currently monitored for the following issues for this low-risk pregnancy and has Supervision of normal pregnancy, antepartum on her problem list.  Patient reports no complaints.   .  .   . Denies leaking of fluid.   The following portions of the patient's history were reviewed and updated as appropriate: allergies, current medications, past family history, past medical history, past social history, past surgical history and problem list. Problem list updated.  Objective:  There were no vitals filed for this visit.  Fetal Status:           General:  Alert, oriented and cooperative. Patient is in no acute distress.  Skin: Skin is warm and dry. No rash noted.   Cardiovascular: Normal heart rate noted  Respiratory: Normal respiratory effort, no problems with respiration noted  Abdomen: Soft, gravid, appropriate for gestational age.       Pelvic:  Cervical exam deferred        Extremities: Normal range of motion.     Mental Status: Normal mood and affect. Normal behavior. Normal judgment and thought content.   Assessment and Plan:  Pregnancy: G1P0 at [redacted]w[redacted]d  1. Supervision of normal first pregnancy, antepartum -Cervical cultures tomorrow  Preterm labor symptoms and general obstetric precautions including but not limited to vaginal bleeding, contractions, leaking of fluid and fetal movement were reviewed in detail with the patient. Please refer to After Visit Summary for other counseling recommendations.  No Follow-up on file.   Allie Bossier, MD

## 2016-12-11 ENCOUNTER — Encounter: Payer: Self-pay | Admitting: *Deleted

## 2016-12-23 ENCOUNTER — Ambulatory Visit (INDEPENDENT_AMBULATORY_CARE_PROVIDER_SITE_OTHER): Payer: Medicaid Other | Admitting: Obstetrics and Gynecology

## 2016-12-23 ENCOUNTER — Other Ambulatory Visit (HOSPITAL_COMMUNITY)
Admission: RE | Admit: 2016-12-23 | Discharge: 2016-12-23 | Disposition: A | Discharge status: Home / Self Care | Payer: Medicaid Other | Source: Ambulatory Visit | Attending: Obstetrics and Gynecology | Admitting: Obstetrics and Gynecology

## 2016-12-23 VITALS — BP 125/77 | HR 85 | Wt 208.0 lb

## 2016-12-23 DIAGNOSIS — Z3403 Encounter for supervision of normal first pregnancy, third trimester: Secondary | ICD-10-CM

## 2016-12-23 DIAGNOSIS — Z3400 Encounter for supervision of normal first pregnancy, unspecified trimester: Secondary | ICD-10-CM

## 2016-12-23 LAB — OB RESULTS CONSOLE GC/CHLAMYDIA: GC PROBE AMP, GENITAL: NEGATIVE

## 2016-12-23 NOTE — Addendum Note (Signed)
Addended by: Elby Beck F on: 12/23/2016 03:34 PM   Modules accepted: Orders

## 2016-12-23 NOTE — Progress Notes (Signed)
Patient reports some pelvic bone pain.

## 2016-12-23 NOTE — Progress Notes (Addendum)
   PRENATAL VISIT NOTE  Subjective:  Madison Wilkinson is a 21 y.o. G1P0 at [redacted]w[redacted]d being seen today for ongoing prenatal care.  She is currently monitored for the following issues for this low-risk pregnancy and has Supervision of normal pregnancy, antepartum on her problem list.  Patient reports no complaints.  Contractions: Not present. Vag. Bleeding: None.  Movement: Present. Denies leaking of fluid.   The following portions of the patient's history were reviewed and updated as appropriate: allergies, current medications, past family history, past medical history, past social history, past surgical history and problem list. Problem list updated.  Objective:   Vitals:   12/23/16 1446  BP: 125/77  Pulse: 85  Weight: 208 lb (94.3 kg)    Fetal Status: Fetal Heart Rate (bpm): 141 Fundal Height: 36 cm Movement: Present  Presentation: Vertex  General:  Alert, oriented and cooperative. Patient is in no acute distress.  Skin: Skin is warm and dry. No rash noted.   Cardiovascular: Normal heart rate noted  Respiratory: Normal respiratory effort, no problems with respiration noted  Abdomen: Soft, gravid, appropriate for gestational age. Pain/Pressure: Present     Pelvic:  Cervical exam performed Dilation: Closed Effacement (%): Thick Station: Ballotable  Extremities: Normal range of motion.  Edema: None  Mental Status: Normal mood and affect. Normal behavior. Normal judgment and thought content.   Assessment and Plan:  Pregnancy: G1P0 at [redacted]w[redacted]d  1. Supervision of normal first pregnancy, antepartum Patient is doing well Cultures collected - Strep Gp B NAA - GC/Chlamydia Probe Amp  Preterm labor symptoms and general obstetric precautions including but not limited to vaginal bleeding, contractions, leaking of fluid and fetal movement were reviewed in detail with the patient. Please refer to After Visit Summary for other counseling recommendations.  Return in about 1 week (around 12/30/2016)  for ROB.   Catalina Antigua, MD

## 2016-12-24 LAB — CERVICOVAGINAL ANCILLARY ONLY
Chlamydia: NEGATIVE
Neisseria Gonorrhea: NEGATIVE

## 2016-12-25 LAB — STREP GP B NAA: STREP GROUP B AG: POSITIVE — AB

## 2016-12-26 ENCOUNTER — Encounter: Payer: Self-pay | Admitting: Obstetrics and Gynecology

## 2016-12-26 DIAGNOSIS — O99820 Streptococcus B carrier state complicating pregnancy: Secondary | ICD-10-CM | POA: Insufficient documentation

## 2016-12-30 ENCOUNTER — Ambulatory Visit (INDEPENDENT_AMBULATORY_CARE_PROVIDER_SITE_OTHER): Payer: Medicaid Other | Admitting: Obstetrics and Gynecology

## 2016-12-30 VITALS — BP 137/84 | HR 96 | Wt 210.1 lb

## 2016-12-30 DIAGNOSIS — O99820 Streptococcus B carrier state complicating pregnancy: Secondary | ICD-10-CM

## 2016-12-30 DIAGNOSIS — Z3400 Encounter for supervision of normal first pregnancy, unspecified trimester: Secondary | ICD-10-CM

## 2016-12-30 DIAGNOSIS — Z3403 Encounter for supervision of normal first pregnancy, third trimester: Secondary | ICD-10-CM

## 2016-12-30 NOTE — Progress Notes (Signed)
   PRENATAL VISIT NOTE  Subjective:  Madison Wilkinson is a 21 y.o. G1P0 at [redacted]w[redacted]d being seen today for ongoing prenatal care.  She is currently monitored for the following issues for this low-risk pregnancy and has Supervision of normal pregnancy, antepartum and GBS (group B Streptococcus carrier), +RV culture, currently pregnant on her problem list.  Patient reports no complaints.  Contractions: Not present.  .  Movement: Present. Denies leaking of fluid.   The following portions of the patient's history were reviewed and updated as appropriate: allergies, current medications, past family history, past medical history, past social history, past surgical history and problem list. Problem list updated.  Objective:   Vitals:   12/30/16 1108  BP: 137/84  Pulse: 96  Weight: 210 lb 2 oz (95.3 kg)    Fetal Status: Fetal Heart Rate (bpm): 122 Fundal Height: 37 cm Movement: Present     General:  Alert, oriented and cooperative. Patient is in no acute distress.  Skin: Skin is warm and dry. No rash noted.   Cardiovascular: Normal heart rate noted  Respiratory: Normal respiratory effort, no problems with respiration noted  Abdomen: Soft, gravid, appropriate for gestational age. Pain/Pressure: Present     Pelvic:  Cervical exam deferred        Extremities: Normal range of motion.  Edema: Trace  Mental Status: Normal mood and affect. Normal behavior. Normal judgment and thought content.   Assessment and Plan:  Pregnancy: G1P0 at [redacted]w[redacted]d  1. GBS (group B Streptococcus carrier), +RV culture, currently pregnant Patient informed of results and will receive antibiotics for prophylaxis in labor  2. Supervision of normal first pregnancy, antepartum Patient is doing well without complaints Answered questions  Preterm labor symptoms and general obstetric precautions including but not limited to vaginal bleeding, contractions, leaking of fluid and fetal movement were reviewed in detail with the  patient. Please refer to After Visit Summary for other counseling recommendations.  Return in about 1 week (around 01/06/2017) for ROB.   Catalina Antigua, MD

## 2017-01-06 ENCOUNTER — Ambulatory Visit (INDEPENDENT_AMBULATORY_CARE_PROVIDER_SITE_OTHER): Payer: Medicaid Other | Admitting: Obstetrics and Gynecology

## 2017-01-06 VITALS — BP 117/82 | HR 106 | Wt 212.9 lb

## 2017-01-06 DIAGNOSIS — Z3400 Encounter for supervision of normal first pregnancy, unspecified trimester: Secondary | ICD-10-CM

## 2017-01-06 DIAGNOSIS — O99820 Streptococcus B carrier state complicating pregnancy: Secondary | ICD-10-CM

## 2017-01-06 DIAGNOSIS — Z3483 Encounter for supervision of other normal pregnancy, third trimester: Secondary | ICD-10-CM

## 2017-01-06 NOTE — Progress Notes (Signed)
   PRENATAL VISIT NOTE  Subjective:  Madison Wilkinson is a 21 y.o. G1P0 at [redacted]w[redacted]d being seen today for ongoing prenatal care.  She is currently monitored for the following issues for this low-risk pregnancy and has Supervision of normal pregnancy, antepartum and GBS (group B Streptococcus carrier), +RV culture, currently pregnant on her problem list.  Patient reports no complaints.  Contractions: Not present. Vag. Bleeding: None.  Movement: Present. Denies leaking of fluid.   The following portions of the patient's history were reviewed and updated as appropriate: allergies, current medications, past family history, past medical history, past social history, past surgical history and problem list. Problem list updated.  Objective:   Vitals:   01/06/17 1023  BP: 117/82  Pulse: (!) 106  Weight: 212 lb 14.4 oz (96.6 kg)    Fetal Status: Fetal Heart Rate (bpm): 134 Fundal Height: 38 cm Movement: Present     General:  Alert, oriented and cooperative. Patient is in no acute distress.  Skin: Skin is warm and dry. No rash noted.   Cardiovascular: Normal heart rate noted  Respiratory: Normal respiratory effort, no problems with respiration noted  Abdomen: Soft, gravid, appropriate for gestational age. Pain/Pressure: Absent     Pelvic:  Cervical exam deferred        Extremities: Normal range of motion.  Edema: None  Mental Status: Normal mood and affect. Normal behavior. Normal judgment and thought content.   Assessment and Plan:  Pregnancy: G1P0 at [redacted]w[redacted]d  1. Supervision of normal first pregnancy, antepartum Patient is doing well without complaints  2. GBS (group B Streptococcus carrier), +RV culture, currently pregnant Will provide prophylaxis in labor  Term labor symptoms and general obstetric precautions including but not limited to vaginal bleeding, contractions, leaking of fluid and fetal movement were reviewed in detail with the patient. Please refer to After Visit Summary for other  counseling recommendations.  Return in about 1 week (around 01/13/2017) for ROB.   Catalina Antigua, MD

## 2017-01-06 NOTE — Progress Notes (Signed)
Patient reports good fetal movement, denies pain. 

## 2017-01-13 ENCOUNTER — Ambulatory Visit (INDEPENDENT_AMBULATORY_CARE_PROVIDER_SITE_OTHER): Payer: Medicaid Other | Admitting: Obstetrics and Gynecology

## 2017-01-13 VITALS — BP 137/83 | HR 93 | Wt 215.2 lb

## 2017-01-13 DIAGNOSIS — Z3400 Encounter for supervision of normal first pregnancy, unspecified trimester: Secondary | ICD-10-CM

## 2017-01-13 DIAGNOSIS — Z3483 Encounter for supervision of other normal pregnancy, third trimester: Secondary | ICD-10-CM

## 2017-01-13 DIAGNOSIS — O99820 Streptococcus B carrier state complicating pregnancy: Secondary | ICD-10-CM

## 2017-01-13 NOTE — Progress Notes (Signed)
   PRENATAL VISIT NOTE  Subjective:  [name] is a 21 y.o. G1P0 at 6682w6d being seen today for ongoing prenatal care.  She is currently monitored for the following issues for this low-risk pregnancy and has Supervision of normal pregnancy, antepartum and GBS (group B Streptococcus carrier), +RV culture, currently pregnant on her problem list.  Patient reports no complaints.  Contractions: Irregular. Vag. Bleeding: None.  Movement: Present. Denies leaking of fluid.   The following portions of the patient's history were reviewed and updated as appropriate: allergies, current medications, past family history, past medical history, past social history, past surgical history and problem list. Problem list updated.  Objective:   Vitals:   01/13/17 1438  BP: 137/83  Pulse: 93  Weight: 215 lb 3.2 oz (97.6 kg)    Fetal Status: Fetal Heart Rate (bpm): 154 Fundal Height: 39 cm Movement: Present     General:  Alert, oriented and cooperative. Patient is in no acute distress.  Skin: Skin is warm and dry. No rash noted.   Cardiovascular: Normal heart rate noted  Respiratory: Normal respiratory effort, no problems with respiration noted  Abdomen: Soft, gravid, appropriate for gestational age. Pain/Pressure: Absent     Pelvic:  Cervical exam deferred        Extremities: Normal range of motion.  Edema: Trace  Mental Status: Normal mood and affect. Normal behavior. Normal judgment and thought content.   Assessment and Plan:  Pregnancy: G1P0 at 182w6d  1. GBS (group B Streptococcus carrier), +RV culture, currently pregnant Will provide prophylaxis in labor  2. Supervision of normal first pregnancy, antepartum Patient is doing well Will plan for IOL at 41 weeks if no SOL  Term labor symptoms and general obstetric precautions including but not limited to vaginal bleeding, contractions, leaking of fluid and fetal movement were reviewed in detail with the patient. Please refer to After Visit  Summary for other counseling recommendations.  No Follow-up on file.   Mckenlee Mangham, Gigi GinPeggy, MD

## 2017-01-13 NOTE — Progress Notes (Signed)
Patient reports no concerns today 

## 2017-01-20 ENCOUNTER — Inpatient Hospital Stay (HOSPITAL_COMMUNITY)
Admission: AD | Admit: 2017-01-20 | Discharge: 2017-01-20 | Disposition: A | Discharge status: Home / Self Care | Payer: Medicaid Other | Source: Ambulatory Visit | Attending: Obstetrics and Gynecology | Admitting: Obstetrics and Gynecology

## 2017-01-20 ENCOUNTER — Ambulatory Visit (INDEPENDENT_AMBULATORY_CARE_PROVIDER_SITE_OTHER): Payer: Medicaid Other | Admitting: Obstetrics and Gynecology

## 2017-01-20 ENCOUNTER — Encounter (HOSPITAL_COMMUNITY): Payer: Self-pay | Admitting: *Deleted

## 2017-01-20 VITALS — BP 156/93 | HR 93 | Wt 220.2 lb

## 2017-01-20 DIAGNOSIS — O9989 Other specified diseases and conditions complicating pregnancy, childbirth and the puerperium: Secondary | ICD-10-CM

## 2017-01-20 DIAGNOSIS — Z87891 Personal history of nicotine dependence: Secondary | ICD-10-CM | POA: Diagnosis not present

## 2017-01-20 DIAGNOSIS — Z3A39 39 weeks gestation of pregnancy: Secondary | ICD-10-CM | POA: Diagnosis not present

## 2017-01-20 DIAGNOSIS — O163 Unspecified maternal hypertension, third trimester: Secondary | ICD-10-CM | POA: Insufficient documentation

## 2017-01-20 DIAGNOSIS — R030 Elevated blood-pressure reading, without diagnosis of hypertension: Secondary | ICD-10-CM | POA: Insufficient documentation

## 2017-01-20 DIAGNOSIS — O09893 Supervision of other high risk pregnancies, third trimester: Secondary | ICD-10-CM | POA: Insufficient documentation

## 2017-01-20 DIAGNOSIS — Z3403 Encounter for supervision of normal first pregnancy, third trimester: Secondary | ICD-10-CM

## 2017-01-20 DIAGNOSIS — Z3400 Encounter for supervision of normal first pregnancy, unspecified trimester: Secondary | ICD-10-CM

## 2017-01-20 DIAGNOSIS — O99820 Streptococcus B carrier state complicating pregnancy: Secondary | ICD-10-CM

## 2017-01-20 LAB — COMPREHENSIVE METABOLIC PANEL
ALT: 12 U/L — ABNORMAL LOW (ref 14–54)
ANION GAP: 6 (ref 5–15)
AST: 17 U/L (ref 15–41)
Albumin: 3 g/dL — ABNORMAL LOW (ref 3.5–5.0)
Alkaline Phosphatase: 117 U/L (ref 38–126)
BILIRUBIN TOTAL: 0.4 mg/dL (ref 0.3–1.2)
BUN: 8 mg/dL (ref 6–20)
CHLORIDE: 108 mmol/L (ref 101–111)
CO2: 23 mmol/L (ref 22–32)
Calcium: 8.5 mg/dL — ABNORMAL LOW (ref 8.9–10.3)
Creatinine, Ser: 0.67 mg/dL (ref 0.44–1.00)
Glucose, Bld: 78 mg/dL (ref 65–99)
POTASSIUM: 3.8 mmol/L (ref 3.5–5.1)
Sodium: 137 mmol/L (ref 135–145)
TOTAL PROTEIN: 6.4 g/dL — AB (ref 6.5–8.1)

## 2017-01-20 LAB — URINALYSIS, ROUTINE W REFLEX MICROSCOPIC
Bilirubin Urine: NEGATIVE
Glucose, UA: 50 mg/dL — AB
HGB URINE DIPSTICK: NEGATIVE
Ketones, ur: NEGATIVE mg/dL
NITRITE: NEGATIVE
PH: 5 (ref 5.0–8.0)
Protein, ur: 100 mg/dL — AB
SPECIFIC GRAVITY, URINE: 1.024 (ref 1.005–1.030)

## 2017-01-20 LAB — CBC
HCT: 33.6 % — ABNORMAL LOW (ref 36.0–46.0)
HEMOGLOBIN: 11.3 g/dL — AB (ref 12.0–15.0)
MCH: 28.9 pg (ref 26.0–34.0)
MCHC: 33.6 g/dL (ref 30.0–36.0)
MCV: 85.9 fL (ref 78.0–100.0)
Platelets: 183 10*3/uL (ref 150–400)
RBC: 3.91 MIL/uL (ref 3.87–5.11)
RDW: 13.4 % (ref 11.5–15.5)
WBC: 9.1 10*3/uL (ref 4.0–10.5)

## 2017-01-20 LAB — PROTEIN / CREATININE RATIO, URINE
Creatinine, Urine: 287 mg/dL
PROTEIN CREATININE RATIO: 0.24 mg/mg{creat} — AB (ref 0.00–0.15)
TOTAL PROTEIN, URINE: 69 mg/dL

## 2017-01-20 NOTE — Discharge Instructions (Signed)
Preeclampsia and Eclampsia  Preeclampsia is a serious condition that develops only during pregnancy. It is also called toxemia of pregnancy. This condition causes high blood pressure along with other symptoms, such as swelling and headaches. These symptoms may develop as the condition gets worse. Preeclampsia may occur at 20 weeks of pregnancy or later.  Diagnosing and treating preeclampsia early is very important. If not treated early, it can cause serious problems for you and your baby. One problem it can lead to is eclampsia, which is a condition that causes muscle jerking or shaking (convulsions or seizures) in the mother. Delivering your baby is the best treatment for preeclampsia or eclampsia. Preeclampsia and eclampsia symptoms usually go away after your baby is born.  What are the causes?  The cause of preeclampsia is not known.  What increases the risk?  The following risk factors make you more likely to develop preeclampsia:  · Being pregnant for the first time.  · Having had preeclampsia during a past pregnancy.  · Having a family history of preeclampsia.  · Having high blood pressure.  · Being pregnant with twins or triplets.  · Being 35 or older.  · Being African-American.  · Having kidney disease or diabetes.  · Having medical conditions such as lupus or blood diseases.  · Being very overweight (obese).    What are the signs or symptoms?  The earliest signs of preeclampsia are:  · High blood pressure.  · Increased protein in your urine. Your health care provider will check for this at every visit before you give birth (prenatal visit).    Other symptoms that may develop as the condition gets worse include:  · Severe headaches.  · Sudden weight gain.  · Swelling of the hands, face, legs, and feet.  · Nausea and vomiting.  · Vision problems, such as blurred or double vision.  · Numbness in the face, arms, legs, and feet.  · Urinating less than usual.  · Dizziness.  · Slurred speech.  · Abdominal pain,  especially upper abdominal pain.  · Convulsions or seizures.    Symptoms generally go away after giving birth.  How is this diagnosed?  There are no screening tests for preeclampsia. Your health care provider will ask you about symptoms and check for signs of preeclampsia during your prenatal visits. You may also have tests that include:  · Urine tests.  · Blood tests.  · Checking your blood pressure.  · Monitoring your baby’s heart rate.  · Ultrasound.    How is this treated?  You and your health care provider will determine the treatment approach that is best for you. Treatment may include:  · Having more frequent prenatal exams to check for signs of preeclampsia, if you have an increased risk for preeclampsia.  · Bed rest.  · Reducing how much salt (sodium) you eat.  · Medicine to lower your blood pressure.  · Staying in the hospital, if your condition is severe. There, treatment will focus on controlling your blood pressure and the amount of fluids in your body (fluid retention).  · You may need to take medicine (magnesium sulfate) to prevent seizures. This medicine may be given as an injection or through an IV tube.  · Delivering your baby early, if your condition gets worse. You may have your labor started with medicine (induced), or you may have a cesarean delivery.    Follow these instructions at home:  Eating and drinking     ·   you need help quitting, ask your health care provider.  Do not use alcohol or drugs.  Avoid stress as much as possible. Rest and get plenty of sleep. General instructions   Take over-the-counter and prescription medicines only as told by your  health care provider.  When lying down, lie on your side. This keeps pressure off of your baby.  When sitting or lying down, raise (elevate) your feet. Try putting some pillows underneath your lower legs.  Exercise regularly. Ask your health care provider what kinds of exercise are best for you.  Keep all follow-up and prenatal visits as told by your health care provider. This is important. How is this prevented? To prevent preeclampsia or eclampsia from developing during another pregnancy:  Get proper medical care during pregnancy. Your health care provider may be able to prevent preeclampsia or diagnose and treat it early.  Your health care provider may have you take a low-dose aspirin or a calcium supplement during your next pregnancy.  You may have tests of your blood pressure and kidney function after giving birth.  Maintain a healthy weight. Ask your health care provider for help managing weight gain during pregnancy.  Work with your health care provider to manage any long-term (chronic) health conditions you have, such as diabetes or kidney problems. Contact a health care provider if:  You gain more weight than expected.  You have headaches.  You have nausea or vomiting.  You have abdominal pain.  You feel dizzy or light-headed. Get help right away if:  You develop sudden or severe swelling anywhere in your body. This usually happens in the legs.  You gain 5 lbs (2.3 kg) or more during one week.  You have severe:  Abdominal pain.  Headaches.  Dizziness.  Vision problems.  Confusion.  Nausea or vomiting.  You have a seizure.  You have trouble moving any part of your body.  You develop numbness in any part of your body.  You have trouble speaking.  You have any abnormal bleeding.  You pass out. This information is not intended to replace advice given to you by your health care provider. Make sure you discuss any questions you have with your health  care provider. Document Released: 08/23/2000 Document Revised: 04/23/2016 Document Reviewed: 04/01/2016 Elsevier Interactive Patient Education  2017 [employer] Hypertension During Pregnancy Hypertension is also called high blood pressure. High blood pressure means that the force of your blood moving in your body is too strong. When you are pregnant, this condition should be watched carefully. It can cause problems for you and your baby. Follow these instructions at home: Eating and drinking   Drink enough fluid to keep your pee (urine) clear or pale yellow.  Eat healthy foods that are low in salt (sodium).  Do not add salt to your food.  Check labels on foods and drinks to see much salt is in them. Look on the label where you see "Sodium." Lifestyle   Do not use any products that contain nicotine or tobacco, such as cigarettes and e-cigarettes. If you need help quitting, ask your doctor.  Do not use alcohol.  Avoid caffeine.  Avoid stress. Rest and get plenty of sleep. General instructions   Take over-the-counter and prescription medicines only as told by your doctor.  While lying down, lie on your left side. This keeps pressure off your baby.  While sitting or lying down, raise (elevate) your feet. Try putting some pillows under your lower legs.  Exercise  regularly. Ask your doctor what kinds of exercise are best for you.  Keep all prenatal and follow-up visits as told by your doctor. This is important. Contact a doctor if:  You have symptoms that your doctor told you to watch for, such as:  Fever.  Throwing up (vomiting).  Headache. Get help right away if:  You have very bad pain in your belly (abdomen).  You are throwing up, and this does not get better with treatment.  You suddenly get swelling in your hands, ankles, or face.  You gain 4 lb (1.8 kg) or more in 1 week.  You get bleeding from your vagina.  You have blood in your pee.  You do not feel  your baby moving as much as normal.  You have a change in vision.  You have muscle twitching or sudden tightening (spasms).  You have trouble breathing.  Your lips or fingernails turn blue. This information is not intended to replace advice given to you by your health care provider. Make sure you discuss any questions you have with your health care provider. Document Released: 09/28/2010 Document Revised: 05/07/2016 Document Reviewed: 05/07/2016 Elsevier Interactive Patient Education  2017 [employer].

## 2017-01-20 NOTE — MAU Note (Signed)
Patient was at ob today and sent over for further evaluation of elevated blood pressure. Denies headache, change in vision, pain at time time. Patient states noticed an increase in pedal swelling; patient states has been on feet a lot the last few days with moving into a new house.

## 2017-01-20 NOTE — Progress Notes (Signed)
Patient has been moving so her feet are swollen today- she has not had any symptoms of labor.

## 2017-01-20 NOTE — MAU Provider Note (Signed)
History     CSN: [id number]  Arrival date and time: 01/20/17 1136   First Provider Initiated Contact with Patient 01/20/17 1335      Chief Complaint  Patient presents with  . Hypertension   HPI   Ms.DELLAR TRABER is a 21 y.o. female G1P0 @ 45w6dhere in MAU for [facility]. [facility] [place] [facility] visit. She was sent here for further evaluation. [facility] [place] [facility]93: the patient says she was nervous for her appointment today because it is getting toward the end.   + fetal movements Denies vaginal bleeding.   OB History    Gravida Para Term Preterm AB Living   1             SAB TAB Ectopic Multiple Live Births                  Past Medical History:  Diagnosis Date  . Medical history non-contributory     Past Surgical History:  Procedure Laterality Date  . NO PAST SURGERIES      Family History  Problem Relation Age of Onset  . Cancer Paternal Grandfather   . Diabetes Paternal Grandfather   . Heart attack Paternal Grandfather     Social History  Substance Use Topics  . Smoking status: Former Smoker    Packs/day: 1.00  . Smokeless tobacco: Never Used  . Alcohol use No    Allergies: No Known Allergies  Prescriptions Prior to Admission  Medication Sig Dispense Refill Last Dose  . acetaminophen (TYLENOL) 325 MG tablet Take 650 mg by mouth every 6 (six) hours as needed.   Taking  . calcium carbonate (TUMS EX) 750 MG chewable tablet Chew 1 tablet by mouth daily.   Taking  . Prenatal MV-Min-FA-Omega-3 (PRENATAL GUMMIES/DHA & FA) 0.4-32.5 MG CHEW Chew by mouth.   Taking   Results for orders placed or performed during the hospital encounter of 01/20/17 (from the past 72 hour(s))  Comprehensive metabolic panel     Status: Abnormal   Collection Time: 01/20/17 12:05 PM  Result Value Ref Range   Sodium 137 135 - 145 mmol/L   Potassium 3.8 3.5 - 5.1 mmol/L   Chloride 108 101 - 111 mmol/L   CO2 23 22 - 32  mmol/L   Glucose, Bld 78 65 - 99 mg/dL   BUN 8 6 - 20 mg/dL   Creatinine, Ser 0.67 0.44 - 1.00 mg/dL   Calcium 8.5 (L) 8.9 - 10.3 mg/dL   Total Protein 6.4 (L) 6.5 - 8.1 g/dL   Albumin 3.0 (L) 3.5 - 5.0 g/dL   AST 17 15 - 41 U/L   ALT 12 (L) 14 - 54 U/L   Alkaline Phosphatase 117 38 - 126 U/L   Total Bilirubin 0.4 0.3 - 1.2 mg/dL   GFR calc non Af Amer >60 >60 mL/min   GFR calc Af Amer >60 >60 mL/min    Comment: (NOTE) The eGFR has been calculated using the CKD EPI equation. This calculation has not been validated in all clinical situations. eGFR's persistently <60 mL/min signify possible Chronic Kidney Disease.    Anion gap 6 5 - 15  CBC     Status: Abnormal   Collection Time: 01/20/17 12:05 PM  Result Value Ref Range   WBC 9.1 4.0 - 10.5 K/uL   RBC 3.91 3.87 - 5.11 MIL/uL   Hemoglobin 11.3 (L) 12.0 - 15.0  g/dL   HCT 33.6 (L) 36.0 - 46.0 %   MCV 85.9 78.0 - 100.0 fL   MCH 28.9 26.0 - 34.0 pg   MCHC 33.6 30.0 - 36.0 g/dL   RDW 13.4 11.5 - 15.5 %   Platelets 183 150 - 400 K/uL  Protein / creatinine ratio, urine     Status: Abnormal   Collection Time: 01/20/17 12:21 PM  Result Value Ref Range   Creatinine, Urine 287.00 mg/dL   Total Protein, Urine 69 mg/dL    Comment: NO NORMAL RANGE ESTABLISHED FOR THIS TEST   Protein Creatinine Ratio 0.24 (H) 0.00 - 0.15 mg/mg[Cre]  Urinalysis, Routine w reflex microscopic     Status: Abnormal   Collection Time: 01/20/17 12:21 PM  Result Value Ref Range   Color, Urine AMBER (A) YELLOW    Comment: BIOCHEMICALS MAY BE AFFECTED BY COLOR   APPearance CLOUDY (A) CLEAR   Specific Gravity, Urine 1.024 1.005 - 1.030   pH 5.0 5.0 - 8.0   Glucose, UA 50 (A) NEGATIVE mg/dL   Hgb urine dipstick NEGATIVE NEGATIVE   Bilirubin Urine NEGATIVE NEGATIVE   Ketones, ur NEGATIVE NEGATIVE mg/dL   Protein, ur 100 (A) NEGATIVE mg/dL   Nitrite NEGATIVE NEGATIVE   Leukocytes, UA LARGE (A) NEGATIVE   RBC / HPF 0-5 0 - 5 RBC/hpf   WBC, UA TOO NUMEROUS TO  COUNT 0 - 5 WBC/hpf   Bacteria, UA RARE (A) NONE SEEN   Squamous Epithelial / LPF TOO NUMEROUS TO COUNT (A) NONE SEEN   Mucous PRESENT    Non Squamous Epithelial 0-5 (A) NONE SEEN    Review of Systems  Eyes: Negative for photophobia and visual disturbance.  Gastrointestinal: Negative for abdominal pain.  Genitourinary: Negative for dysuria.  Neurological: Negative for headaches.   Physical Exam   Blood pressure 128/78, pulse 75, temperature 97.7 F (36.5 C), temperature source Oral, resp. rate 18, weight 220 lb 1.9 oz (99.8 kg), last menstrual period 03/24/2016, SpO2 99 %.   Patient Vitals for the past 24 hrs:  BP Temp Temp src Pulse Resp SpO2 Weight  01/20/17 1345 135/85 - - 83 18 99 % -  01/20/17 1340 138/81 - - 72 - - -  01/20/17 1331 128/78 - - 75 - - -  01/20/17 1316 133/76 - - 84 - - -  01/20/17 1312 139/86 - - 83 - - -  01/20/17 1310 135/79 - - 74 - - -  01/20/17 1219 126/83 97.7 F (36.5 C) Oral 91 18 99 % 220 lb 1.9 oz (99.8 kg)   Physical Exam  Constitutional: She is oriented to person, place, and time. She appears well-developed and well-nourished. No distress.  HENT:  Head: Normocephalic.  Eyes: Pupils are equal, round, and reactive to light.  Cardiovascular: Normal rate.   Respiratory: Effort normal.  GI: Soft. She exhibits no distension. There is no tenderness. There is no rebound.  Musculoskeletal: Normal range of motion.  Neurological: She is alert and oriented to person, place, and time. She has normal reflexes. She displays normal reflexes.  Negative clonus   Skin: Skin is warm. She is not diaphoretic.  Psychiatric: Her behavior is normal.   Fetal Tracing: Baseline: 125 bpm Variability: Moderate  Accelerations: 15x15 Decelerations: None Toco: Quiet   MAU Course  Procedures  None  MDM  Elevated BP readings in the WOC > BP readings WNL here in MAU. No severe range BP readings  Patient is asymptomatic   Assessment and  Plan   A:  1.  Elevated BP without diagnosis of hypertension     P:  Discharge home in stable condition Strict return precautions Go to the [facility] tomorrow @ 11:00 for BP check Preeclampsia precautions  Coulson Wehner, Artist Pais, NP 01/20/2017 4:23 PM

## 2017-01-20 NOTE — Progress Notes (Signed)
   PRENATAL VISIT NOTE  Subjective:  Madison Wilkinson is a 21 y.o. G1P0 at 112w6d being seen today for ongoing prenatal care.  She is currently monitored for the following issues for this low-risk pregnancy and has Supervision of normal pregnancy, antepartum and GBS (group B Streptococcus carrier), +RV culture, currently pregnant on her problem list.  Patient reports no complaints.  Contractions: Irregular. Vag. Bleeding: None.  Movement: Present. Denies leaking of fluid.   The following portions of the patient's history were reviewed and updated as appropriate: allergies, current medications, past family history, past medical history, past social history, past surgical history and problem list. Problem list updated.  Objective:   Vitals:   01/20/17 1045  BP: (!) 156/92  Pulse: 93  Weight: 220 lb 3.2 oz (99.9 kg)    Fetal Status: Fetal Heart Rate (bpm): 139 (Simultaneous filing. User may not have seen previous data.) Fundal Height: 39 cm Movement: Present     General:  Alert, oriented and cooperative. Patient is in no acute distress.  Skin: Skin is warm and dry. No rash noted.   Cardiovascular: Normal heart rate noted  Respiratory: Normal respiratory effort, no problems with respiration noted  Abdomen: Soft, gravid, appropriate for gestational age. Pain/Pressure: Absent     Pelvic:  Cervical exam performed Dilation: Closed Effacement (%): Thick Station: Ballotable  Extremities: Normal range of motion.  Edema: Trace  Mental Status: Normal mood and affect. Normal behavior. Normal judgment and thought content.   Assessment and Plan:  Pregnancy: G1P0 at 112w6d  1. GBS (group B Streptococcus carrier), +RV culture, currently pregnant Will provide prophylaxis in labor  2. Supervision of normal first pregnancy, antepartum Patient is doing well without complaints She denies HA, visual changes, RUQ/epigastric pain Elevated BP today. Patient sent to MAU for further evaluation and possible  IOL Plan for postdate fetal testing on Friday if undelivered IOL on 5/22 pending no other complications  Term labor symptoms and general obstetric precautions including but not limited to vaginal bleeding, contractions, leaking of fluid and fetal movement were reviewed in detail with the patient. Please refer to After Visit Summary for other counseling recommendations.  No Follow-up on file.   [name], Gigi GinPeggy, MD

## 2017-01-21 ENCOUNTER — Ambulatory Visit: Payer: Medicaid Other | Admitting: *Deleted

## 2017-01-21 VITALS — BP 136/87 | HR 63

## 2017-01-21 DIAGNOSIS — Z0130 Encounter for examination of blood pressure without abnormal findings: Secondary | ICD-10-CM

## 2017-01-21 LAB — CULTURE, OB URINE
CULTURE: NO GROWTH
SPECIAL REQUESTS: NORMAL

## 2017-01-21 NOTE — Progress Notes (Signed)
Patient presents to clinic for BP check. Patient reports no headaches, no visual disturbances, no abd pain. Baby is very active. BP wnl. Reviewed symptoms of preeclampsia with patient. Patient voiced understanding and knows to come in if she experiences headache, visual disturbances, abd pain, or severe swelling.

## 2017-01-24 ENCOUNTER — Ambulatory Visit (INDEPENDENT_AMBULATORY_CARE_PROVIDER_SITE_OTHER): Payer: Medicaid Other | Admitting: *Deleted

## 2017-01-24 ENCOUNTER — Other Ambulatory Visit: Payer: Self-pay | Admitting: Advanced Practice Midwife

## 2017-01-24 ENCOUNTER — Ambulatory Visit: Payer: Self-pay

## 2017-01-24 VITALS — BP 139/88 | HR 84

## 2017-01-24 DIAGNOSIS — O480 Post-term pregnancy: Secondary | ICD-10-CM

## 2017-01-24 NOTE — Progress Notes (Addendum)
Pt denies H/A or visual disturbances. She had Pre-e evaluation @ [facility] on 5/14.  Pre-e sx reviewed and pt advised to go to [facility] for evaluation if they occur. IOL scheduled 5/22.  Pt informed that the ultrasound is considered a limited OB ultrasound and is not intended to be a complete ultrasound exam.  Patient also informed that the ultrasound is not being completed with the intent of assessing for fetal or placental anomalies or any pelvic abnormalities.  Explained that the purpose of today's ultrasound is to assess for presentation and amniotic fluid volume.  Patient acknowledges the purpose of the exam and the limitations of the study.

## 2017-01-27 ENCOUNTER — Ambulatory Visit: Payer: Medicaid Other

## 2017-01-27 ENCOUNTER — Telehealth (HOSPITAL_COMMUNITY): Payer: Self-pay | Admitting: *Deleted

## 2017-01-27 ENCOUNTER — Encounter (HOSPITAL_COMMUNITY): Payer: Self-pay | Admitting: *Deleted

## 2017-01-27 ENCOUNTER — Ambulatory Visit (INDEPENDENT_AMBULATORY_CARE_PROVIDER_SITE_OTHER): Payer: Medicaid Other | Admitting: Obstetrics and Gynecology

## 2017-01-27 VITALS — BP 136/86 | HR 65 | Wt 222.0 lb

## 2017-01-27 DIAGNOSIS — O480 Post-term pregnancy: Secondary | ICD-10-CM | POA: Diagnosis not present

## 2017-01-27 DIAGNOSIS — Z3400 Encounter for supervision of normal first pregnancy, unspecified trimester: Secondary | ICD-10-CM

## 2017-01-27 DIAGNOSIS — Z3483 Encounter for supervision of other normal pregnancy, third trimester: Secondary | ICD-10-CM

## 2017-01-27 DIAGNOSIS — O99820 Streptococcus B carrier state complicating pregnancy: Secondary | ICD-10-CM

## 2017-01-27 NOTE — Progress Notes (Signed)
   PRENATAL VISIT NOTE  Subjective:  [name] is a 21 y.o. G1P0 at 6147w6d being seen today for ongoing prenatal care.  She is currently monitored for the following issues for this low-risk pregnancy and has Supervision of normal pregnancy, antepartum and GBS (group B Streptococcus carrier), +RV culture, currently pregnant on her problem list.  Patient reports no complaints but very anxious.  Contractions: Not present. Vag. Bleeding: None.  Movement: Present. Denies leaking of fluid.   The following portions of the patient's history were reviewed and updated as appropriate: allergies, current medications, past family history, past medical history, past social history, past surgical history and problem list. Problem list updated.  Objective:   Vitals:   01/27/17 1014 01/27/17 1109  BP: (!) 146/96 136/86  Pulse: 65   Weight: 222 lb (100.7 kg)     Fetal Status: Fetal Heart Rate (bpm): AFI/NST   Movement: Present  Presentation: Vertex  General:  Alert, oriented and cooperative. Patient is in no acute distress.  Skin: Skin is warm and dry. No rash noted.   Cardiovascular: Normal heart rate noted  Respiratory: Normal respiratory effort, no problems with respiration noted  Abdomen: Soft, gravid, appropriate for gestational age. Pain/Pressure: Absent     Pelvic:  Cervical exam deferred        Extremities: Normal range of motion.  Edema: Trace  Mental Status: Normal mood and affect. Normal behavior. Normal judgment and thought content.   Assessment and Plan:  Pregnancy: G1P0 at 4247w6d  1. Supervision of normal first pregnancy, antepartum Patient is doing well. Answered all questions regarding expectations during IOL process scheduled to start tomorrow Patient to receive antibiotics for GBS prophylaxis NST reviewed and reactive with baseline 120, mod variability, +accels, no decels. Ctx q 3 minutes but patient is unaware Pt informed that the ultrasound is considered a limited OB  ultrasound and is not intended to be a complete ultrasound exam.  Patient also informed that the ultrasound is not being completed with the intent of assessing for fetal or placental anomalies or any pelvic abnormalities.  Explained that the purpose of today's ultrasound is to assess for  presentation and AFI which demonstrated a fetus in vertex position with AFI 16.77.  Patient acknowledges the purpose of the exam and the limitations of the study.       Term labor symptoms and general obstetric precautions including but not limited to vaginal bleeding, contractions, leaking of fluid and fetal movement were reviewed in detail with the patient. Please refer to After Visit Summary for other counseling recommendations.  Return in about 6 weeks (around 03/10/2017), or pp visit.   Catalina AntiguaPeggy Anneli Bing, MD

## 2017-01-27 NOTE — Telephone Encounter (Signed)
Preadmission screen  

## 2017-01-27 NOTE — Progress Notes (Signed)
NST performed today was reviewed and was found to be reactive.  AFI was also normal.  Continue recommended antenatal testing and prenatal care. 

## 2017-01-28 ENCOUNTER — Encounter (HOSPITAL_COMMUNITY): Payer: Self-pay

## 2017-01-28 ENCOUNTER — Ambulatory Visit: Payer: Medicaid Other

## 2017-01-28 ENCOUNTER — Inpatient Hospital Stay (HOSPITAL_COMMUNITY)
Admission: RE | Admit: 2017-01-28 | Discharge: 2017-01-31 | DRG: 774 | Disposition: A | Discharge status: Home / Self Care | Payer: Medicaid Other | Source: Ambulatory Visit | Attending: Family Medicine | Admitting: Family Medicine

## 2017-01-28 VITALS — BP 135/93 | HR 84 | Temp 97.7°F | Resp 18 | Ht 62.0 in | Wt 222.0 lb

## 2017-01-28 DIAGNOSIS — O1205 Gestational edema, complicating the puerperium: Secondary | ICD-10-CM | POA: Diagnosis present

## 2017-01-28 DIAGNOSIS — O1404 Mild to moderate pre-eclampsia, complicating childbirth: Secondary | ICD-10-CM | POA: Diagnosis present

## 2017-01-28 DIAGNOSIS — O9952 Diseases of the respiratory system complicating childbirth: Secondary | ICD-10-CM | POA: Diagnosis present

## 2017-01-28 DIAGNOSIS — O480 Post-term pregnancy: Secondary | ICD-10-CM | POA: Diagnosis present

## 2017-01-28 DIAGNOSIS — Z3A41 41 weeks gestation of pregnancy: Secondary | ICD-10-CM

## 2017-01-28 DIAGNOSIS — O1490 Unspecified pre-eclampsia, unspecified trimester: Secondary | ICD-10-CM | POA: Diagnosis present

## 2017-01-28 DIAGNOSIS — O99824 Streptococcus B carrier state complicating childbirth: Secondary | ICD-10-CM | POA: Diagnosis present

## 2017-01-28 DIAGNOSIS — O99820 Streptococcus B carrier state complicating pregnancy: Secondary | ICD-10-CM

## 2017-01-28 DIAGNOSIS — O1493 Unspecified pre-eclampsia, third trimester: Secondary | ICD-10-CM

## 2017-01-28 DIAGNOSIS — Z87891 Personal history of nicotine dependence: Secondary | ICD-10-CM | POA: Diagnosis not present

## 2017-01-28 HISTORY — DX: Anxiety disorder, unspecified: F41.9

## 2017-01-28 HISTORY — DX: Unspecified asthma, uncomplicated: J45.909

## 2017-01-28 LAB — COMPREHENSIVE METABOLIC PANEL
ALK PHOS: 133 U/L — AB (ref 38–126)
ALT: 12 U/L — AB (ref 14–54)
AST: 19 U/L (ref 15–41)
Albumin: 2.9 g/dL — ABNORMAL LOW (ref 3.5–5.0)
Anion gap: 8 (ref 5–15)
BILIRUBIN TOTAL: 0.2 mg/dL — AB (ref 0.3–1.2)
BUN: 10 mg/dL (ref 6–20)
CALCIUM: 9.2 mg/dL (ref 8.9–10.3)
CO2: 21 mmol/L — ABNORMAL LOW (ref 22–32)
CREATININE: 0.58 mg/dL (ref 0.44–1.00)
Chloride: 107 mmol/L (ref 101–111)
Glucose, Bld: 78 mg/dL (ref 65–99)
Potassium: 4.1 mmol/L (ref 3.5–5.1)
Sodium: 136 mmol/L (ref 135–145)
Total Protein: 6 g/dL — ABNORMAL LOW (ref 6.5–8.1)

## 2017-01-28 LAB — CBC
HCT: 33.6 % — ABNORMAL LOW (ref 36.0–46.0)
Hemoglobin: 11.2 g/dL — ABNORMAL LOW (ref 12.0–15.0)
MCH: 28.6 pg (ref 26.0–34.0)
MCHC: 33.3 g/dL (ref 30.0–36.0)
MCV: 85.9 fL (ref 78.0–100.0)
Platelets: 182 10*3/uL (ref 150–400)
RBC: 3.91 MIL/uL (ref 3.87–5.11)
RDW: 13.3 % (ref 11.5–15.5)
WBC: 9.2 10*3/uL (ref 4.0–10.5)

## 2017-01-28 LAB — TYPE AND SCREEN
ABO/RH(D): O POS
Antibody Screen: NEGATIVE

## 2017-01-28 LAB — RPR: RPR Ser Ql: NONREACTIVE

## 2017-01-28 LAB — PROTEIN / CREATININE RATIO, URINE
CREATININE, URINE: 56 mg/dL
Protein Creatinine Ratio: 0.86 mg/mg{Cre} — ABNORMAL HIGH (ref 0.00–0.15)
TOTAL PROTEIN, URINE: 48 mg/dL

## 2017-01-28 LAB — ABO/RH: ABO/RH(D): O POS

## 2017-01-28 MED ORDER — TERBUTALINE SULFATE 1 MG/ML IJ SOLN: 0.2500 mg | Freq: Once | INTRAMUSCULAR | Status: DC | PRN

## 2017-01-28 MED ORDER — PENICILLIN G POT IN DEXTROSE 60000 UNIT/ML IV SOLN
3.0000 10*6.[IU] | INTRAVENOUS | Status: DC
  Administered 2017-01-28 – 2017-01-29 (×6): 3 10*6.[IU] via INTRAVENOUS
  Filled 2017-01-28 (×12): qty 50

## 2017-01-28 MED ORDER — MISOPROSTOL 25 MCG QUARTER TABLET: 25.0000 ug | ORAL_TABLET | ORAL | Status: DC | PRN

## 2017-01-28 MED ORDER — ACETAMINOPHEN 325 MG PO TABS
650.0000 mg | ORAL_TABLET | ORAL | Status: DC | PRN
  Administered 2017-01-28: 650 mg via ORAL
  Filled 2017-01-28: qty 2

## 2017-01-28 MED ORDER — LACTATED RINGERS IV SOLN: 500.0000 mL | INTRAVENOUS | Status: DC | PRN

## 2017-01-28 MED ORDER — LACTATED RINGERS IV SOLN
INTRAVENOUS | Status: DC
  Administered 2017-01-28 (×2): via INTRAVENOUS
  Administered 2017-01-29: 125 mL/h via INTRAVENOUS

## 2017-01-28 MED ORDER — TERBUTALINE SULFATE 1 MG/ML IJ SOLN
0.2500 mg | Freq: Once | INTRAMUSCULAR | Status: DC | PRN
  Filled 2017-01-28: qty 1

## 2017-01-28 MED ORDER — OXYTOCIN 40 UNITS IN LACTATED RINGERS INFUSION - SIMPLE MED
1.0000 m[IU]/min | INTRAVENOUS | Status: DC
Start: 2017-01-28 — End: 2017-01-29
  Administered 2017-01-29: 2 m[IU]/min via INTRAVENOUS
  Filled 2017-01-28: qty 1000

## 2017-01-28 MED ORDER — LIDOCAINE HCL (PF) 1 % IJ SOLN
30.0000 mL | INTRAMUSCULAR | Status: DC | PRN
  Filled 2017-01-28: qty 30

## 2017-01-28 MED ORDER — FENTANYL CITRATE (PF) 100 MCG/2ML IJ SOLN
100.0000 ug | INTRAMUSCULAR | Status: DC | PRN
  Administered 2017-01-28 – 2017-01-29 (×8): 100 ug via INTRAVENOUS
  Filled 2017-01-28 (×8): qty 2

## 2017-01-28 MED ORDER — SOD CITRATE-CITRIC ACID 500-334 MG/5ML PO SOLN: 30.0000 mL | ORAL | Status: DC | PRN

## 2017-01-28 MED ORDER — MISOPROSTOL 50MCG HALF TABLET
50.0000 ug | ORAL_TABLET | ORAL | Status: DC | PRN
  Administered 2017-01-28: 50 ug via BUCCAL
  Filled 2017-01-28 (×2): qty 1

## 2017-01-28 MED ORDER — OXYCODONE-ACETAMINOPHEN 5-325 MG PO TABS: 2.0000 | ORAL_TABLET | ORAL | Status: DC | PRN

## 2017-01-28 MED ORDER — OXYTOCIN 40 UNITS IN LACTATED RINGERS INFUSION - SIMPLE MED: 2.5000 [IU]/h | INTRAVENOUS | Status: DC

## 2017-01-28 MED ORDER — OXYCODONE-ACETAMINOPHEN 5-325 MG PO TABS: 1.0000 | ORAL_TABLET | ORAL | Status: DC | PRN

## 2017-01-28 MED ORDER — MISOPROSTOL 25 MCG QUARTER TABLET
25.0000 ug | ORAL_TABLET | ORAL | Status: DC | PRN
  Administered 2017-01-28 (×2): 25 ug via VAGINAL
  Filled 2017-01-28 (×4): qty 1

## 2017-01-28 MED ORDER — DEXTROSE 5 % IV SOLN
5.0000 10*6.[IU] | Freq: Once | INTRAVENOUS | Status: AC
  Administered 2017-01-28: 5 10*6.[IU] via INTRAVENOUS
  Filled 2017-01-28: qty 5

## 2017-01-28 MED ORDER — ONDANSETRON HCL 4 MG/2ML IJ SOLN
4.0000 mg | Freq: Four times a day (QID) | INTRAMUSCULAR | Status: DC | PRN
  Administered 2017-01-29: 4 mg via INTRAVENOUS
  Filled 2017-01-28: qty 2

## 2017-01-28 MED ORDER — OXYTOCIN BOLUS FROM INFUSION
500.0000 mL | Freq: Once | INTRAVENOUS | Status: AC
  Administered 2017-01-29: 500 mL via INTRAVENOUS

## 2017-01-28 NOTE — Progress Notes (Signed)
S: Patient seen & examined for progress of labor.  U/S confirmed vertex.    Dilation: Fingertip Effacement (%): Thick Cervical Position: Middle Presentation: Vertex (by ultrasound) Exam by:: dr Ashok Pallwouk   FHT: 130bpm, mod var, +accels, no decels TOCO: q302min  A/P: 21yo G1P0 with preE no severe features.  - Continue cytotec 25 mcg vaginal q4H - continue to monitor BP  Continue expectant management Anticipate SVD

## 2017-01-28 NOTE — H&P (Signed)
Madison Wilkinson is a 21 y.o. female G1P0 at 7 weeks presenting for induction of labor for postdates. She denies any contractions, leaking of fluid or vaginal bleeding. Reports good fetal movement.    Clinic [facility] Prenatal Labs  Dating LMP 03-24-16  Blood type:   O pos  Genetic Screen  Quad:  neg Antibody: neg  Anatomic [place] Normal- limited views of lips Rubella:   NR   GTT Early: 110 (1hr)              Third trimester: nl 2 hr RPR: Non Reactive (02/19 1110) NR  Flu vaccine 05-28-16 at [facility] HBsAg:   Neg  TDaP vaccine    11/11/16                                           HIV: Non Reactive (02/19 1110) NR  Baby Food Breast                                             ZOX:[id number] (04/16 1508)neg (For PCN allergy, check sensitivities)  Contraception pills Pap: 07/09/16 Normal   Circumcision Femina    Pediatrician [place]    Support Person FOB     OB History    Gravida Para Term Preterm AB Living   1             SAB TAB Ectopic Multiple Live Births                 Past Medical History:  Diagnosis Date  . Asthma    Past Surgical History:  Procedure Laterality Date  . NO PAST SURGERIES     Family History: family history includes Cancer in her paternal grandfather; Diabetes in her paternal grandfather; Heart attack in her paternal grandfather. Social History:  reports that she has quit smoking. She smoked 1.00 pack per day. She has never used smokeless tobacco. She reports that she does not drink alcohol or use drugs.     Maternal Diabetes: No Genetic Screening: Normal Maternal Ultrasounds/Referrals: Normal Fetal Ultrasounds or other Referrals:  None Maternal Substance Abuse:  No Significant Maternal Medications:  None Significant Maternal Lab Results:  None Other Comments:  None  Review of Systems  Constitutional: Negative.   Respiratory: Negative.  Negative for shortness of breath.   Cardiovascular: Negative.  Negative for chest pain.  Gastrointestinal: Negative for  abdominal pain.  Neurological: Negative.    Maternal Medical History:  Reason for admission: IOL for PD  Contractions: Frequency: irregular.   Duration is approximately 80 seconds.   Perceived severity is mild.    Fetal activity: Perceived fetal activity is normal.    Prenatal complications: no prenatal complications Prenatal Complications - Diabetes: none.      Blood pressure (!) 158/104, pulse 84, temperature 98.3 F (36.8 C), resp. rate 18, height 5\' 2"  (1.575 m), weight 222 lb (100.7 kg), last menstrual period 03/24/2016. Maternal Exam:  Uterine Assessment: Contraction strength is mild.  Contraction duration is 80 seconds. Contraction frequency is irregular.   Abdomen: Patient reports no abdominal tenderness. Fetal presentation: vertex  Introitus: Ferning test: not done.  Nitrazine test: not done. Amniotic fluid character: not assessed.  Cervix: Cervix evaluated by digital exam.     Fetal Exam Fetal  Monitor Review: Mode: ultrasound.   Baseline rate: 135.  Variability: moderate (6-25 bpm).   Pattern: accelerations present and no decelerations.    Fetal State Assessment: Category I - tracings are normal.     Physical Exam  Nursing note and vitals reviewed. Constitutional: She appears well-developed and well-nourished.  HENT:  Head: Normocephalic and atraumatic.  Eyes: Conjunctivae are normal. No scleral icterus.  Respiratory: Effort normal. No respiratory distress.  GI: Soft. There is no tenderness.  Neurological: She is alert.  Skin: Skin is warm and dry.  Psychiatric: She has a normal mood and affect. Her behavior is normal. Judgment and thought content normal.    Prenatal labs: ABO, Rh:  O Pos Antibody:  Neg Rubella:  Non reactive RPR: Non Reactive (02/19 1110)  HBsAg:   Negative HIV: Non Reactive (02/19 1110)  GBS: Positive (04/16 1508)   Assessment/Plan: IUP at 41 weeks. IOL for PD. GBS pos  Vertex presentation confirmed by bedside u/s.  Elevated BP at admission without symptoms, labs pending.   Plan: Cytotec for cervical ripening and foley bulb when able. PCN for GBS prophylaxis. Plans for epidural for pain management.     Anticipate NSVD  Cleone SlimCaroline Neill SNM 01/28/2017, 7:45 AM  I confirm that I have verified the information documented in the nurse midwife student's note and that I have also personally reperformed the physical exam and all medical decision making activities. Bedside US: vertex    Thressa ShellerHeather Aliese Brannum 8:43 AM 01/28/17

## 2017-01-28 NOTE — Anesthesia Pain Management Evaluation Note (Signed)
  CRNA Pain Management Visit Note  Patient: Madison Wilkinson, 21 y.o., female  "Hello I am a member of the anesthesia team at [facility]. We have an anesthesia team available at all times to provide care throughout the hospital, including epidural management and anesthesia for C-section. I don't know your plan for the delivery whether it a natural birth, water birth, IV sedation, nitrous supplementation, doula or epidural, but we want to meet your pain goals."   1.Was your pain managed to your expectations on prior hospitalizations?   No prior hospitalizations  2.What is your expectation for pain management during this hospitalization?     Epidural  3.How can we help you reach that goal? epidural  Record the patient's initial score and the patient's pain goal.   Pain: 0  Pain Goal: 4 [facility] wants you to be able to say your pain was always managed very well.  Madison HickmanGREGORY,Kamaya Keckler 01/28/2017

## 2017-01-28 NOTE — Progress Notes (Signed)
LABOR PROGRESS NOTE  Madison Wilkinson is a 21 y.o. G1P0 at 4530w0d  admitted for pdiol, now with mild preE.  Subjective: Denying pain or contractions.   Objective: BP (!) 138/96   Pulse 72   Temp 98.2 F (36.8 C) (Oral)   Resp 18   Ht 5\' 2"  (1.575 m)   Wt 222 lb (100.7 kg)   LMP 03/24/2016   BMI 40.60 kg/m  or  Vitals:   01/28/17 1605 01/28/17 1722 01/28/17 1845 01/28/17 2117  BP: (!) 150/91 140/72 (!) 144/85 (!) 138/96  Pulse: (!) 55 64 69 72  Resp: 18 18 18    Temp:      TempSrc:      Weight:      Height:        140/mod/-a/-d Dilation: 1 Effacement (%): Thick Cervical Position: Middle Presentation: Vertex Exam by:: dr Ashok Pallwouk  Labs: Lab Results  Component Value Date   WBC 9.2 01/28/2017   HGB 11.2 (L) 01/28/2017   HCT 33.6 (L) 01/28/2017   MCV 85.9 01/28/2017   PLT 182 01/28/2017    Patient Active Problem List   Diagnosis Date Noted  . Post term pregnancy at [redacted] weeks gestation 01/28/2017  . Preeclampsia 01/28/2017  . GBS (group B Streptococcus carrier), +RV culture, currently pregnant 12/26/2016  . Supervision of normal pregnancy, antepartum 07/09/2016    Assessment / Plan: 21 y.o. G1P0 at 2430w0d here for pdiol, now with mild preE  Labor: Foley in place, Cytotec in place, continue ripening Fetal Wellbeing:  Cat 1 Pain Control:  Epidural upon patient request Anticipated MOD:  Vaginal PreE: No severe range pressures. Mild, continue to monitor  Expectant management.   Tarri AbernethyAbigail J Gwyn Mehring, MD 01/28/2017, 11:12 PM

## 2017-01-28 NOTE — Progress Notes (Signed)
Contractions every few minutes but very mild. 140/mod/-a/-d. No ha, vision change, upper abdominal pain. Mild but persistent bp elevations, upc 0.86 - new dx preE. On exam closed/thick/high, vertex by u/s. Will continue cytotec 25 mcg vaginal q4 hrs, attempt foley again when able. New dx preE, continue to monitor, as no s/s severe disease.

## 2017-01-28 NOTE — Progress Notes (Signed)
LABOR PROGRESS NOTE  Montel ClockSabrina A Budnick is a 21 y.o. G1P0 at 1850w0d  admitted for pdiol, now with mild preE  Subjective: Mild pain  Objective: BP (!) 144/85   Pulse 69   Temp 98.2 F (36.8 C) (Oral)   Resp 18   Ht 5\' 2"  (1.575 m)   Wt 222 lb (100.7 kg)   LMP 03/24/2016   BMI 40.60 kg/m  or  Vitals:   01/28/17 1516 01/28/17 1605 01/28/17 1722 01/28/17 1845  BP: (!) 145/97 (!) 150/91 140/72 (!) 144/85  Pulse: 67 (!) 55 64 69  Resp: 18 18 18 18   Temp:      TempSrc:      Weight:      Height:        140/mod/-a/-d Dilation: 1 Effacement (%): Thick Cervical Position: Middle Presentation: Vertex Exam by:: dr Ashok Pallwouk  Labs: Lab Results  Component Value Date   WBC 9.2 01/28/2017   HGB 11.2 (L) 01/28/2017   HCT 33.6 (L) 01/28/2017   MCV 85.9 01/28/2017   PLT 182 01/28/2017    Patient Active Problem List   Diagnosis Date Noted  . Post term pregnancy at [redacted] weeks gestation 01/28/2017  . GBS (group B Streptococcus carrier), +RV culture, currently pregnant 12/26/2016  . Supervision of normal pregnancy, antepartum 07/09/2016    Assessment / Plan: 21 y.o. G1P0 at 3650w0d here for pdiol, now with mild preE  Labor: foley placed using speculum, cytotec placed, continue ripening Fetal Wellbeing:  Cat 1 Pain Control:  Eventual epidural Anticipated MOD:  Vag PreE: mild, continue to monitor  Silvano BilisNoah B Wouk, MD 01/28/2017, 6:48 PM

## 2017-01-29 ENCOUNTER — Inpatient Hospital Stay (HOSPITAL_COMMUNITY): Payer: Medicaid Other | Admitting: Anesthesiology

## 2017-01-29 ENCOUNTER — Encounter (HOSPITAL_COMMUNITY): Payer: Self-pay

## 2017-01-29 DIAGNOSIS — Z3A41 41 weeks gestation of pregnancy: Secondary | ICD-10-CM

## 2017-01-29 DIAGNOSIS — O480 Post-term pregnancy: Secondary | ICD-10-CM

## 2017-01-29 DIAGNOSIS — O99824 Streptococcus B carrier state complicating childbirth: Secondary | ICD-10-CM

## 2017-01-29 LAB — CBC
HCT: 34.4 % — ABNORMAL LOW (ref 36.0–46.0)
HEMATOCRIT: 34.1 % — AB (ref 36.0–46.0)
HEMOGLOBIN: 11.3 g/dL — AB (ref 12.0–15.0)
HEMOGLOBIN: 11.6 g/dL — AB (ref 12.0–15.0)
MCH: 28.5 pg (ref 26.0–34.0)
MCH: 28.9 pg (ref 26.0–34.0)
MCHC: 33.1 g/dL (ref 30.0–36.0)
MCHC: 33.7 g/dL (ref 30.0–36.0)
MCV: 85.9 fL (ref 78.0–100.0)
MCV: 85.6 fL (ref 78.0–100.0)
PLATELETS: 169 10*3/uL (ref 150–400)
Platelets: 176 10*3/uL (ref 150–400)
RBC: 3.97 MIL/uL (ref 3.87–5.11)
RBC: 4.02 MIL/uL (ref 3.87–5.11)
RDW: 13.4 % (ref 11.5–15.5)
RDW: 13.5 % (ref 11.5–15.5)
WBC: 11.9 10*3/uL — AB (ref 4.0–10.5)
WBC: 15.2 10*3/uL — ABNORMAL HIGH (ref 4.0–10.5)

## 2017-01-29 MED ORDER — EPHEDRINE 5 MG/ML INJ
10.0000 mg | INTRAVENOUS | Status: DC | PRN
  Filled 2017-01-29: qty 2

## 2017-01-29 MED ORDER — BENZOCAINE-MENTHOL 20-0.5 % EX AERO
1.0000 "application " | INHALATION_SPRAY | CUTANEOUS | Status: DC | PRN
  Administered 2017-01-29 – 2017-01-31 (×2): 1 via TOPICAL
  Filled 2017-01-29 (×3): qty 56

## 2017-01-29 MED ORDER — DIBUCAINE 1 % RE OINT: 1.0000 "application " | TOPICAL_OINTMENT | RECTAL | Status: DC | PRN

## 2017-01-29 MED ORDER — LIDOCAINE HCL (PF) 1 % IJ SOLN
INTRAMUSCULAR | Status: DC | PRN
  Administered 2017-01-29 (×2): 5 mL

## 2017-01-29 MED ORDER — DIPHENHYDRAMINE HCL 50 MG/ML IJ SOLN: 12.5000 mg | INTRAMUSCULAR | Status: DC | PRN

## 2017-01-29 MED ORDER — ZOLPIDEM TARTRATE 5 MG PO TABS: 5.0000 mg | ORAL_TABLET | Freq: Every evening | ORAL | Status: DC | PRN

## 2017-01-29 MED ORDER — IBUPROFEN 600 MG PO TABS
600.0000 mg | ORAL_TABLET | Freq: Four times a day (QID) | ORAL | Status: DC
  Administered 2017-01-29 – 2017-01-31 (×7): 600 mg via ORAL
  Filled 2017-01-29 (×7): qty 1

## 2017-01-29 MED ORDER — ONDANSETRON HCL 4 MG/2ML IJ SOLN: 4.0000 mg | INTRAMUSCULAR | Status: DC | PRN

## 2017-01-29 MED ORDER — PHENYLEPHRINE 40 MCG/ML (10ML) SYRINGE FOR IV PUSH (FOR BLOOD PRESSURE SUPPORT)
80.0000 ug | PREFILLED_SYRINGE | INTRAVENOUS | Status: DC | PRN
  Filled 2017-01-29: qty 5
  Filled 2017-01-29: qty 10

## 2017-01-29 MED ORDER — COCONUT OIL OIL: 1.0000 "application " | TOPICAL_OIL | Status: DC | PRN

## 2017-01-29 MED ORDER — FENTANYL 2.5 MCG/ML BUPIVACAINE 1/10 % EPIDURAL INFUSION (WH - ANES)
14.0000 mL/h | INTRAMUSCULAR | Status: DC | PRN
  Administered 2017-01-29: 14 mL/h via EPIDURAL
  Filled 2017-01-29: qty 100

## 2017-01-29 MED ORDER — WITCH HAZEL-GLYCERIN EX PADS: 1.0000 "application " | MEDICATED_PAD | CUTANEOUS | Status: DC | PRN

## 2017-01-29 MED ORDER — TETANUS-DIPHTH-ACELL PERTUSSIS 5-2.5-18.5 LF-MCG/0.5 IM SUSP: 0.5000 mL | Freq: Once | INTRAMUSCULAR | Status: DC

## 2017-01-29 MED ORDER — ONDANSETRON HCL 4 MG PO TABS: 4.0000 mg | ORAL_TABLET | ORAL | Status: DC | PRN

## 2017-01-29 MED ORDER — PRENATAL MULTIVITAMIN CH
1.0000 | ORAL_TABLET | Freq: Every day | ORAL | Status: DC
  Administered 2017-01-30: 1 via ORAL
  Filled 2017-01-29: qty 1

## 2017-01-29 MED ORDER — LACTATED RINGERS IV SOLN: 500.0000 mL | Freq: Once | INTRAVENOUS | Status: DC

## 2017-01-29 MED ORDER — ACETAMINOPHEN 325 MG PO TABS
650.0000 mg | ORAL_TABLET | ORAL | Status: DC | PRN
  Administered 2017-01-29 – 2017-01-30 (×2): 650 mg via ORAL
  Filled 2017-01-29 (×2): qty 2

## 2017-01-29 MED ORDER — SIMETHICONE 80 MG PO CHEW: 80.0000 mg | CHEWABLE_TABLET | ORAL | Status: DC | PRN

## 2017-01-29 MED ORDER — PHENYLEPHRINE 40 MCG/ML (10ML) SYRINGE FOR IV PUSH (FOR BLOOD PRESSURE SUPPORT)
80.0000 ug | PREFILLED_SYRINGE | INTRAVENOUS | Status: DC | PRN
  Filled 2017-01-29: qty 5

## 2017-01-29 MED ORDER — DIPHENHYDRAMINE HCL 25 MG PO CAPS: 25.0000 mg | ORAL_CAPSULE | Freq: Four times a day (QID) | ORAL | Status: DC | PRN

## 2017-01-29 MED ORDER — SENNOSIDES-DOCUSATE SODIUM 8.6-50 MG PO TABS
2.0000 | ORAL_TABLET | ORAL | Status: DC
  Administered 2017-01-29 – 2017-01-30 (×2): 2 via ORAL
  Filled 2017-01-29 (×2): qty 2

## 2017-01-29 NOTE — Anesthesia Procedure Notes (Signed)
Epidural Patient location during procedure: OB  Staffing Anesthesiologist: Sha Amer Performed: anesthesiologist   Preanesthetic Checklist Completed: patient identified, site marked, surgical consent, pre-op evaluation, timeout performed, IV checked, risks and benefits discussed and monitors and equipment checked  Epidural Patient position: sitting Prep: DuraPrep Patient monitoring: heart rate, continuous pulse ox and blood pressure Approach: right paramedian Location: L3-L4 Injection technique: LOR saline  Needle:  Needle type: Tuohy  Needle gauge: 17 G Needle length: 9 cm and 9 Needle insertion depth: 7 cm Catheter type: closed end flexible Catheter size: 20 Guage Catheter at skin depth: 11 cm Test dose: negative  Assessment Events: blood not aspirated, injection not painful, no injection resistance, negative IV test and no paresthesia  Additional Notes Patient identified. Risks/Benefits/Options discussed with patient including but not limited to bleeding, infection, nerve damage, paralysis, failed block, incomplete pain control, headache, blood pressure changes, nausea, vomiting, reactions to medication both or allergic, itching and postpartum back pain. Confirmed with bedside nurse the patient's most recent platelet count. Confirmed with patient that they are not currently taking any anticoagulation, have any bleeding history or any family history of bleeding disorders. Patient expressed understanding and wished to proceed. All questions were answered. Sterile technique was used throughout the entire procedure. Please see nursing notes for vital signs. Test dose was given through epidural needle and negative prior to continuing to dose epidural or start infusion. Warning signs of high block given to the patient including shortness of breath, tingling/numbness in hands, complete motor block, or any concerning symptoms with instructions to call for help. Patient was given  instructions on fall risk and not to get out of bed. All questions and concerns addressed with instructions to call with any issues.     

## 2017-01-29 NOTE — Anesthesia Postprocedure Evaluation (Signed)
Anesthesia Post Note  Patient: Madison Wilkinson  Procedure(s) Performed: * No procedures listed *  Patient location during evaluation: Mother Baby Anesthesia Type: Epidural Level of consciousness: awake, awake and alert, oriented and patient cooperative Pain management: pain level controlled Vital Signs Assessment: post-procedure vital signs reviewed and stable Respiratory status: spontaneous breathing, nonlabored ventilation and respiratory function stable Cardiovascular status: stable Postop Assessment: no headache, no backache, epidural receding, patient able to bend at knees and no signs of nausea or vomiting Anesthetic complications: no        Last Vitals:  Vitals:   01/29/17 1757 01/29/17 1921  BP: 130/83 133/84  Pulse: (!) 59   Resp: 18 20  Temp: 37.2 C 36.7 C    Last Pain:  Vitals:   01/29/17 1921  TempSrc: Oral  PainSc:    Pain Goal: Patients Stated Pain Goal: 6 (01/28/17 0722)               [name] L

## 2017-01-29 NOTE — Progress Notes (Signed)
S: Patient seen & examined for progress of labor. Pain currently controlled. 4.5/60%/-2/vertex.  AROM for clear fluid @1030 . Category I Tracing   FHT: 130bpm, mod var, +accels, no decels TOCO: q592min   A/P: 21yo G1P0 admitted for IOL due to post dates with mild preE, progressing well.  Continue expectant management Anticipate SVD

## 2017-01-29 NOTE — Anesthesia Preprocedure Evaluation (Signed)
Anesthesia Evaluation  Patient identified by MRN, date of birth, ID band Patient awake    Reviewed: Allergy & Precautions, H&P , NPO status , Patient's Chart, lab work & pertinent test results  History of Anesthesia Complications Negative for: history of anesthetic complications  Airway Mallampati: II  TM Distance: >3 FB Neck ROM: full    Dental no notable dental hx. (+) Teeth Intact   Pulmonary asthma , former smoker,    Pulmonary exam normal breath sounds clear to auscultation       Cardiovascular negative cardio ROS Normal cardiovascular exam Rhythm:regular Rate:Normal     Neuro/Psych negative neurological ROS  negative psych ROS   GI/Hepatic negative GI ROS, Neg liver ROS,   Endo/Other  Morbid obesity  Renal/GU negative Renal ROS  negative genitourinary   Musculoskeletal   Abdominal   Peds  Hematology negative hematology ROS (+)   Anesthesia Other Findings   Reproductive/Obstetrics (+) Pregnancy preeclampsia                             Anesthesia Physical Anesthesia Plan  ASA: II  Anesthesia Plan: Epidural   Post-op Pain Management:    Induction:   Airway Management Planned:   Additional Equipment:   Intra-op Plan:   Post-operative Plan:   Informed Consent: I have reviewed the patients History and Physical, chart, labs and discussed the procedure including the risks, benefits and alternatives for the proposed anesthesia with the patient or authorized representative who has indicated his/her understanding and acceptance.     Plan Discussed with:   Anesthesia Plan Comments:         Anesthesia Quick Evaluation

## 2017-01-29 NOTE — Progress Notes (Signed)
Labor Progress Note  Madison Wilkinson is a 21 y.o. G1P0 at 6246w1d  admitted for induction of labor due to Post dates, now with mild preeclampsia  S: Patient complaining of a little bit of pressure and would like more pain medication. Patient has noticed that she is bleeding a little bit more than before after going to the bathroom.   O:  BP (!) 148/95   Pulse 74   Temp 98.2 F (36.8 C) (Oral)   Resp 17   Ht 5\' 2"  (1.575 m)   Wt 100.7 kg (222 lb)   LMP 03/24/2016   BMI 40.60 kg/m   No intake/output data recorded.  FHT:  FHR: 140 bpm, variability: moderate,  accelerations:  Present,  decelerations:  Absent UC:   regular, every 2-3 minutes SVE:   Dilation: 5 Effacement (%): 70 Station: -2, -3 Exam by:: Courtney ParisLeftwich Kirby, CNM SROM/AROM: Intact  Pitocin @ 6 mu/min  Labs: Lab Results  Component Value Date   WBC 9.2 01/28/2017   HGB 11.2 (L) 01/28/2017   HCT 33.6 (L) 01/28/2017   MCV 85.9 01/28/2017   PLT 182 01/28/2017    Assessment / Plan: 21 y.o. G1P0 5146w1d in early labor Induction of labor due to postterm,  progressing well on pitocin  Labor: Progressing normally Fetal Wellbeing:  Category I Pain Control:  IV pain meds, will get epidural when closer to active labor Anticipated MOD:  NSVD  Expectant management   Ivan AnchorsJohn Adeolu [facility] Hospital[facility] Medical Student 01/29/2017 4:07 AM    CNM attestation:  I have seen and examined this patient; I agree with above documentation in the student's note.   Madison ClockSabrina A Wilkinson is a 21 y.o. G1P0 with IOL for postdates  PE: BP 131/78   Pulse 67   Temp 98.2 F (36.8 C) (Oral)   Resp 16   Ht 5\' 2"  (1.575 m)   Wt 222 lb (100.7 kg)   LMP 03/24/2016   BMI 40.60 kg/m  Gen: calm comfortable, NAD Resp: normal effort, no distress Abd: gravid  ROS, labs, PMH reviewed FHR Category I  Plan: Continue current plan of care with Pitocin  Sharen CounterLisa Leftwich-Kirby, CNM 7:24 AM

## 2017-01-30 MED ORDER — HYDROCHLOROTHIAZIDE 25 MG PO TABS
25.0000 mg | ORAL_TABLET | Freq: Every day | ORAL | Status: DC
  Administered 2017-01-31: 25 mg via ORAL
  Filled 2017-01-30: qty 1

## 2017-01-30 MED ORDER — FUROSEMIDE 40 MG PO TABS
40.0000 mg | ORAL_TABLET | Freq: Every day | ORAL | Status: AC
  Administered 2017-01-30: 40 mg via ORAL
  Filled 2017-01-30: qty 1

## 2017-01-30 MED ORDER — FUROSEMIDE 40 MG PO TABS
40.0000 mg | ORAL_TABLET | Freq: Every day | ORAL | Status: DC
Start: 2017-01-30 — End: 2017-01-30
  Filled 2017-01-30: qty 1

## 2017-01-30 NOTE — Progress Notes (Signed)
Post Partum Day #1 Subjective: up ad lib, voiding and tolerating PO  Objective: Blood pressure 118/82, pulse 82, temperature 97.5 F (36.4 C), temperature source Oral, resp. rate 18, height 5\' 2"  (1.575 m), weight 222 lb (100.7 kg), last menstrual period 03/24/2016, SpO2 97 %, unknown if currently breastfeeding.  Physical Exam:  General: alert, cooperative and no distress Lochia: appropriate Uterine Fundus: firm Incision: 2nd deg lac, healing DVT Evaluation: No evidence of DVT seen on physical exam. No cords or calf tenderness. Calf/Ankle edema is present.   Recent Labs  01/29/17 0831 01/29/17 1701  HGB 11.3* 11.6*  HCT 34.1* 34.4*    Assessment/Plan: Plan for discharge tomorrow and Contraception POP planned.  Lasix 40 mg for lower leg edema X 2 doses.  Blood pressures stable.    LOS: 2 days   Roe CoombsRachelle A Macrae Wiegman, CNM 01/30/2017, 7:25 AM

## 2017-01-30 NOTE — Progress Notes (Signed)
Post Partum Day 1  Subjective:  Madison Wilkinson is a 21 y.o. G1P1001 4681w1d s/p NSVD.  No acute events overnight.  Pt denies problems with ambulating, voiding or po intake.  She denies nausea or vomiting.  Pain is well controlled.  She has had flatus. She has had bowel movement.  Lochia Small.  Plan for birth control is oral progesterone-only contraceptive.  Method of Feeding: breast  Objective: BP 118/82 (BP Location: Left Arm) Comment: large cuff  Pulse 82   Temp 97.5 F (36.4 C) (Oral)   Resp 18   Ht 5\' 2"  (1.575 m)   Wt 100.7 kg (222 lb)   LMP 03/24/2016   SpO2 97%   Breastfeeding? Unknown   BMI 40.60 kg/m   Physical Exam:  General: alert, cooperative and no distress Chest: CTAB Heart: RRR, no m/r/g Abdomen: soft, nontender Uterine Fundus: firm below umbilicus DVT Evaluation: No evidence of DVT seen on physical exam. Extremities: edema present   Recent Labs  01/29/17 0831 01/29/17 1701  HGB 11.3* 11.6*  HCT 34.1* 34.4*    Assessment/Plan:  ASSESSMENT: Madison Wilkinson is a 21 y.o. G1P1001 7381w1d ppd #1 s/p NSVD doing well.   Plan for discharge tomorrow   LOS: 2 days   Ivan AnchorsJohn Adeolu [facility] Medical [facility] 01/30/2017, 7:49 AM

## 2017-01-30 NOTE — Lactation Note (Signed)
This note was copied from a baby's chart. Lactation Consultation Note  Patient Name: Madison Wilkinson Date: 01/30/2017 Reason for consult: Initial assessment  Met with this first time Mom, baby 4 hrs old.  Mom has breastfed baby 8 times already, stating baby is latching deeply, without any discomfort or trauma to nipples.  Baby quiet alert in crib, last feeding 45 mins ago per Mom.  Offered an assist.   Football hold on right side.  Mom hand expressed colostrum easily.  Basics of early breastfeeding discussed.  Baby easily latched deeply, with flanged lips.  Encouraged Mom to continue to support her breast during feedings, and demonstrated alternate breast compression to increase milk transfer.  Identified regular swallowing.   Encouraged Mom to keep baby STS when not sleeping herself.  Talked about the benefits of frequent feedings, and milk supply.  Goal of 8-12 feedings per 24 hrs.  Engorgement prevention and treatment discussed.   Brochure given to Mom with information about IP and OP Lactation services available to her.  Encouraged Mom to call prn for any assistance.  Maternal Data Has patient been taught Hand Expression?: Yes Does the patient have breastfeeding experience prior to this delivery?: No  Feeding Feeding Type: Breast Fed Length of feed: 20 min  LATCH Score/Interventions Latch: Grasps breast easily, tongue down, lips flanged, rhythmical sucking.  Audible Swallowing: Spontaneous and intermittent Intervention(s): Alternate breast massage;Hand expression;Skin to skin  Type of Nipple: Everted at rest and after stimulation  Comfort (Breast/Nipple): Soft / non-tender     Hold (Positioning): Assistance needed to correctly position infant at breast and maintain latch. Intervention(s): Breastfeeding basics reviewed;Support Pillows;Position options;Skin to skin  LATCH Score: 9  Lactation Tools Discussed/Used WIC Program: Yes [facility])   Consult  Status Consult Status: Complete Date: 01/30/17 Follow-up type: Call as needed    Broadus John 01/30/2017, 9:23 AM

## 2017-01-30 NOTE — Progress Notes (Signed)
CSW received consult for hx of Anxiety.  CSW reviewed PNR and does not note any mention of Anxiety dx.  CSW is screening out referral at this time.  Please call CSW if current concerns arise or by MOB's request.

## 2017-01-31 MED ORDER — HYDROCHLOROTHIAZIDE 25 MG PO TABS: 25.0000 mg | ORAL_TABLET | Freq: Every day | ORAL | 2 refills | Status: AC

## 2017-01-31 NOTE — Discharge Summary (Signed)
OB Discharge Summary     Patient Name: Madison ClockSabrina A Chizmar DOB: Feb 01, 1996 MRN: [id number]  Date of admission: 01/28/2017 Delivering MD: Lorne SkeensSCHENK, NICHOLAS MICHAEL   Date of discharge: 01/31/2017  Admitting diagnosis: INDUCTION Intrauterine pregnancy: 4951w1d     Secondary diagnosis:  Active Problems:   Post term pregnancy at [redacted] weeks gestation   Preeclampsia  Additional problems: dx mild pre-e on admit; GBS pos     Discharge diagnosis: Term Pregnancy Delivered and Preeclampsia (mild)                                                                                                Post partum procedures:none  Augmentation: AROM, Pitocin, Cytotec and Foley Balloon  Complications: None  Hospital course:  Induction of Labor With Vaginal Delivery   21 y.o. yo G1P1001 at 6951w1d was admitted to the hospital 01/28/2017 for induction of labor.  Indication for induction: Postdates. Upon admission her BPs were noted to be persistently elevated but not reaching severe range. She did not require mag sulfate during labor/PP. Patient had an uncomplicated labor course as follows: Membrane Rupture Time/Date: 10:30 AM ,01/29/2017   Intrapartum Procedures: Episiotomy: None [1]                                         Lacerations:  2nd degree [3];Periurethral [8]  Patient had delivery of a Viable infant.  Information for the patient's newborn:  Marland Mcalpineeal, Boy Rakiya [[id number]][[id number]]  Delivery Method: Vaginal, Spontaneous Delivery (Filed from Delivery Summary)   01/29/2017  Details of delivery can be found in separate delivery note.  Patient had a routine postpartum course. On PPD#1 she was given Lasix x 1 dose and then started in HCTZ 25 qd and was sent home on the same. Denies s/s pre-e. Patient is discharged home 01/31/17.  Physical exam  Vitals:   01/30/17 0628 01/30/17 0633 01/30/17 1832 01/31/17 0500  BP: (!) 148/94 118/82 124/76 (!) 135/93  Pulse: (!) 57 82 75 84  Resp: 18  18 18   Temp: 97.5 F (36.4 C)   98.3 F (36.8 C) 97.7 F (36.5 C)  TempSrc: Oral  Oral Oral  SpO2: 97%     Weight:      Height:       General: alert and cooperative Lochia: appropriate Uterine Fundus: firm Incision: N/A DVT Evaluation: No evidence of DVT seen on physical exam. Labs: Lab Results  Component Value Date   WBC 15.2 (H) 01/29/2017   HGB 11.6 (L) 01/29/2017   HCT 34.4 (L) 01/29/2017   MCV 85.6 01/29/2017   PLT 169 01/29/2017   CMP Latest Ref Rng & Units 01/28/2017  Glucose 65 - 99 mg/dL 78  BUN 6 - 20 mg/dL 10  Creatinine 7.820.44 - 9.561.00 mg/dL 2.130.58  Sodium 086135 - 578145 mmol/L 136  Potassium 3.5 - 5.1 mmol/L 4.1  Chloride 101 - 111 mmol/L 107  CO2 22 - 32 mmol/L 21(L)  Calcium 8.9 - 10.3 mg/dL 9.2  Total Protein 6.5 -  8.1 g/dL 6.0(L)  Total Bilirubin 0.3 - 1.2 mg/dL 1.6(X)  Alkaline Phos 38 - 126 U/L 133(H)  AST 15 - 41 U/L 19  ALT 14 - 54 U/L 12(L)    Discharge instruction: per After Visit Summary and "Baby and Me Booklet".  After visit meds:  Allergies as of 01/31/2017   No Known Allergies     Medication List    STOP taking these medications   calcium carbonate 750 MG chewable tablet Commonly known as:  TUMS EX     TAKE these medications   acetaminophen 325 MG tablet Commonly known as:  TYLENOL Take 650 mg by mouth every 6 (six) hours as needed.   hydrochlorothiazide 25 MG tablet Commonly known as:  HYDRODIURIL Take 1 tablet (25 mg total) by mouth daily. Start taking on:  02/01/2017   PRENATAL GUMMIES/DHA & FA 0.4-32.5 MG Chew Chew 2 tablets by mouth daily.       Diet: routine diet  Activity: Advance as tolerated. Pelvic rest for 6 weeks.   Outpatient follow up:BP check in 1 week, then 6 wk PP visit Follow up Appt:No future appointments. Follow up Visit:No Follow-up on file.  Postpartum contraception: Combination OCPs  Newborn Data: Live born female  Birth Weight: 8 lb 10.1 oz (3915 g) APGAR: 8, 9  Baby Feeding: Bottle Disposition:home with  mother   01/31/2017 Cam Hai, CNM  10:23 AM

## 2017-01-31 NOTE — Lactation Note (Signed)
This note was copied from a baby's chart. Lactation Consultation Note Mom called for LC d/t baby wouldn't take bottle. Mom unsure going back and forth about how she wants to feed the baby. When Lc entered rm. Mom stated she just wants to formula bottle feed and not pump. LC stated its her decision. LC proceeded to tell mom about engorgement, how to dry up milk, cabbage firm bra, ICE. After that mom stated that she may pump and bottle feed if she will have all of that milk. Discussed supply and demand, pumping q 3 hrs as if BF baby, building storage. Mom has WIC. FOB ask how much does formula cost, suggesting how expensive it is not to give Free BM. Asked mom to rest, after a nap, try to pump, not to expect to get anything, but if she did that was great. Mom stated she understood for stimulation right now. Discussed filling and milk coming in and management.  Patient Name: Madison Wilkinson ZOXWR'UToday's Date: 01/31/2017 Reason for consult: Follow-up assessment   Maternal Data    Feeding Feeding Type: Formula Nipple Type: Slow - flow Length of feed: 3 min  LATCH Score/Interventions Latch: Repeated attempts needed to sustain latch, nipple held in mouth throughout feeding, stimulation needed to elicit sucking reflex. Intervention(s): Adjust position;Assist with latch;Breast massage;Breast compression  Audible Swallowing: Spontaneous and intermittent Intervention(s): Skin to skin;Hand expression;Alternate breast massage  Type of Nipple: Everted at rest and after stimulation Intervention(s): No intervention needed  Comfort (Breast/Nipple): Soft / non-tender     Hold (Positioning): Assistance needed to correctly position infant at breast and maintain latch. Intervention(s): Breastfeeding basics reviewed;Support Pillows;Position options;Skin to skin  LATCH Score: 8  Lactation Tools Discussed/Used Tools: Pump;Bottle Breast pump type: Double-Electric Breast Pump Pump Review: Setup, frequency,  and cleaning;Milk Storage Initiated by:: [name] Naiyah Klostermann RN IBCLC Date initiated:: 01/31/17   Consult Status Consult Status: PRN Date: 01/31/17 Follow-up type: In-patient    Kaedan Richert, Diamond NickelLAURA G 01/31/2017, 2:11 AM

## 2017-01-31 NOTE — Discharge Instructions (Signed)

## 2017-01-31 NOTE — Progress Notes (Signed)
Post Partum Day 2  Subjective:  Madison Wilkinson is a 21 y.o. G1P1001 3479w1d s/p NSVD.  No acute events overnight.  Pt denies problems with ambulating, voiding or po intake.  She denies nausea or vomiting.  Pain is well controlled.  She has had flatus. She has had bowel movement.  Lochia Minimal.  Plan for birth control is oral progesterone-only contraceptive.  Method of Feeding: breast and bottle feeding   Objective: BP (!) 135/93 (BP Location: Right Leg)   Pulse 84   Temp 97.7 F (36.5 C) (Oral)   Resp 18   Ht 5\' 2"  (1.575 m)   Wt 100.7 kg (222 lb)   LMP 03/24/2016   SpO2 97%   Breastfeeding? Unknown   BMI 40.60 kg/m   Physical Exam:  General: alert, cooperative and no distress Chest: CTAB Heart: RRR, no m/r/g Abdomen: soft, nontender Uterine Fundus: firm below umbilicus DVT Evaluation: No evidence of DVT seen on physical exam. Extremities: edema present  Recent Labs  01/29/17 0831 01/29/17 1701  HGB 11.3* 11.6*  HCT 34.1* 34.4*    Assessment/Plan:  ASSESSMENT: Madison ClockSabrina A Wilkinson is a 21 y.o. G1P1001 3979w1d ppd #2 s/p NSVD doing well.   Continue current care   LOS: 3 days   Ivan AnchorsJohn Adeolu [facility] Medical Student 01/31/2017, 8:59 AM

## 2017-01-31 NOTE — Lactation Note (Signed)
This note was copied from a baby's chart. Lactation Consultation Note Mom frustrated d/t baby cluster feeding, baby acting very hungry. Requested formula. Mom stated that she had wanted to bottle/formula feed, but was going to try to BF to see if she liked it. Mom states she had prefer to formula/bottle feed. Mom doesn't have a DEBP at home. Discussed w/mom option of pumping and bottle so she could see how much the baby was getting. Mom stated she would feel better about that.  Hand expressed colostrum after a minute of hand expressing. Heard baby swallowing, baby wasn't aggressive at the breast as if he was hungry. Needed some stimulation to keep sucking.  Mom shown how to use DEBP & how to disassemble, clean, & reassemble parts. Mom knows to pump q3h for 15-20 min. Hand pump given as well.  Discussed milk storage and bottle feeding. Feeding sheet of how much to give baby at hours of age. Patient Name: Madison Warren DanesSabrina Heyboer WUJWJ'XToday's Date: 01/31/2017 Reason for consult: Follow-up assessment   Maternal Data    Feeding Feeding Type: Breast Fed Nipple Type: Slow - flow Length of feed: 3 min  LATCH Score/Interventions Latch: Repeated attempts needed to sustain latch, nipple held in mouth throughout feeding, stimulation needed to elicit sucking reflex. Intervention(s): Adjust position;Assist with latch;Breast massage;Breast compression  Audible Swallowing: Spontaneous and intermittent Intervention(s): Skin to skin;Hand expression;Alternate breast massage  Type of Nipple: Everted at rest and after stimulation  Comfort (Breast/Nipple): Soft / non-tender     Hold (Positioning): Assistance needed to correctly position infant at breast and maintain latch. Intervention(s): Breastfeeding basics reviewed;Support Pillows;Position options;Skin to skin  LATCH Score: 8  Lactation Tools Discussed/Used Tools: Pump Breast pump type: Double-Electric Breast Pump Pump Review: Setup, frequency, and  cleaning;Milk Storage Initiated by:: [name] [name] RN IBCLC Date initiated:: 01/31/17   Consult Status Consult Status: Follow-up Date: 01/31/17 Follow-up type: In-patient    Addasyn Mcbreen, Diamond NickelLAURA G 01/31/2017, 1:26 AM

## 2017-02-06 ENCOUNTER — Ambulatory Visit: Payer: Medicaid Other

## 2017-02-06 VITALS — BP 127/89 | HR 105

## 2017-02-06 DIAGNOSIS — Z0130 Encounter for examination of blood pressure without abnormal findings: Secondary | ICD-10-CM

## 2017-02-06 NOTE — Progress Notes (Signed)
Nurse visit for BP check. BP WNL. Pt asked does she need to cont. HCTZ. Consulted with Constant, MD, pt is to cont BP meds. Pt informed to schedule pp visit today.

## 2017-03-10 ENCOUNTER — Ambulatory Visit: Payer: Medicaid Other | Admitting: Certified Nurse Midwife

## 2017-03-27 ENCOUNTER — Ambulatory Visit (INDEPENDENT_AMBULATORY_CARE_PROVIDER_SITE_OTHER): Payer: Medicaid Other | Admitting: Certified Nurse Midwife

## 2017-03-27 ENCOUNTER — Encounter: Payer: Self-pay | Admitting: Certified Nurse Midwife

## 2017-03-27 VITALS — BP 128/70 | HR 54 | Ht 63.0 in | Wt 183.2 lb

## 2017-03-27 DIAGNOSIS — Z30013 Encounter for initial prescription of injectable contraceptive: Secondary | ICD-10-CM | POA: Diagnosis not present

## 2017-03-27 MED ORDER — MEDROXYPROGESTERONE ACETATE 150 MG/ML IM SUSP: 150.0000 mg | INTRAMUSCULAR | 4 refills | Status: DC

## 2017-03-27 MED ORDER — MEDROXYPROGESTERONE ACETATE 150 MG/ML IM SUSP
150.0000 mg | INTRAMUSCULAR | Status: AC
  Administered 2017-03-27: 150 mg via INTRAMUSCULAR

## 2017-03-27 NOTE — Progress Notes (Signed)
Patient wants to discuss getting BC pills.   Marland Kitchen..Post Partum Exam  Madison Wilkinson is a 21 y.o. 261P1001 female who presents for a postpartum visit. She is 8 weeks postpartum following a spontaneous vaginal delivery. I have fully reviewed the prenatal and intrapartum course. The delivery was at 41.1 gestational weeks.  Anesthesia: epidural. Postpartum course has been normal. Baby's course has been good. Baby is feeding by bottle - Similac Advance. Bleeding no bleeding. Bowel function is normal. Bladder function is normal. Patient is sexually active. Contraception method is condoms. Postpartum depression screening:neg  The following portions of the patient's history were reviewed and updated as appropriate: allergies, current medications, past family history, past medical history, past social history, past surgical history and problem list.  Review of Systems Pertinent items noted in HPI and remainder of comprehensive ROS otherwise negative.    Objective:  unknown if currently breastfeeding.  General:  alert, cooperative and no distress   Breasts:  inspection negative, no nipple discharge or bleeding, no masses or nodularity palpable  Lungs: clear to auscultation bilaterally  Heart:  regular rate and rhythm, S1, S2 normal, no murmur, click, rub or gallop  Abdomen: soft, non-tender; bowel sounds normal; no masses,  no organomegaly  Pelvic Exam: Not performed.        Assessment:    Normal 8 weeks postpartum exam. Pap smear not done at today's visit.  Last pap smear 07/09/16:normal  1. Encounter for initial prescription of injectable contraceptive      - medroxyPROGESTERone (DEPO-PROVERA) 150 MG/ML injection; Inject 1 mL (150 mg total) into the muscle every 3 (three) months.  Dispense: 1 mL; Refill: 4 - medroxyPROGESTERone (DEPO-PROVERA) injection 150 mg; Inject 1 mL (150 mg total) into the muscle every 3 (three) months.  Plan:   1. Contraception: Depo-Provera injections 2.  F/U every 3  months for Depo injections 3. Follow up in: 4 months for annual exam and every 3 months for depo injections.

## 2017-03-28 ENCOUNTER — Encounter: Payer: Self-pay | Admitting: Certified Nurse Midwife

## 2017-06-18 ENCOUNTER — Ambulatory Visit (INDEPENDENT_AMBULATORY_CARE_PROVIDER_SITE_OTHER): Payer: Medicaid Other

## 2017-06-18 VITALS — BP 112/78 | HR 74 | Ht 63.0 in | Wt 199.0 lb

## 2017-06-18 DIAGNOSIS — Z3042 Encounter for surveillance of injectable contraceptive: Secondary | ICD-10-CM

## 2017-06-18 DIAGNOSIS — Z309 Encounter for contraceptive management, unspecified: Secondary | ICD-10-CM

## 2017-06-18 MED ORDER — MEDROXYPROGESTERONE ACETATE 150 MG/ML IM SUSP
150.0000 mg | Freq: Once | INTRAMUSCULAR | Status: AC
  Administered 2017-06-18: 150 mg via INTRAMUSCULAR

## 2017-06-18 NOTE — Progress Notes (Signed)
Patient is in the office for depo injection, administered and pt tolerated well .Marland Kitchen Administrations This Visit    medroxyPROGESTERone (DEPO-PROVERA) injection 150 mg    Admin Date 06/18/2017 Action Given Dose 150 mg Route Intramuscular Administered By Katrina Stack, RN

## 2017-09-08 ENCOUNTER — Ambulatory Visit: Payer: Medicaid Other

## 2017-09-10 ENCOUNTER — Ambulatory Visit: Payer: Medicaid Other

## 2017-09-17 ENCOUNTER — Other Ambulatory Visit (HOSPITAL_COMMUNITY)
Admission: RE | Admit: 2017-09-17 | Discharge: 2017-09-17 | Disposition: A | Discharge status: Home / Self Care | Payer: Medicaid Other | Source: Ambulatory Visit | Attending: Obstetrics and Gynecology | Admitting: Obstetrics and Gynecology

## 2017-09-17 ENCOUNTER — Encounter: Payer: Self-pay | Admitting: Obstetrics and Gynecology

## 2017-09-17 ENCOUNTER — Ambulatory Visit (INDEPENDENT_AMBULATORY_CARE_PROVIDER_SITE_OTHER): Payer: Medicaid Other | Admitting: Obstetrics and Gynecology

## 2017-09-17 VITALS — BP 109/77 | HR 87 | Ht 62.0 in | Wt 200.0 lb

## 2017-09-17 DIAGNOSIS — Z124 Encounter for screening for malignant neoplasm of cervix: Secondary | ICD-10-CM | POA: Insufficient documentation

## 2017-09-17 DIAGNOSIS — Z1151 Encounter for screening for human papillomavirus (HPV): Secondary | ICD-10-CM | POA: Insufficient documentation

## 2017-09-17 DIAGNOSIS — Z3042 Encounter for surveillance of injectable contraceptive: Secondary | ICD-10-CM

## 2017-09-17 DIAGNOSIS — Z01419 Encounter for gynecological examination (general) (routine) without abnormal findings: Secondary | ICD-10-CM

## 2017-09-17 DIAGNOSIS — Z0000 Encounter for general adult medical examination without abnormal findings: Secondary | ICD-10-CM | POA: Insufficient documentation

## 2017-09-17 DIAGNOSIS — Z309 Encounter for contraceptive management, unspecified: Secondary | ICD-10-CM

## 2017-09-17 DIAGNOSIS — Z113 Encounter for screening for infections with a predominantly sexual mode of transmission: Secondary | ICD-10-CM | POA: Diagnosis not present

## 2017-09-17 MED ORDER — MEDROXYPROGESTERONE ACETATE 150 MG/ML IM SUSP
150.0000 mg | Freq: Once | INTRAMUSCULAR | Status: AC
  Administered 2017-09-17: 150 mg via INTRAMUSCULAR

## 2017-09-17 NOTE — Progress Notes (Signed)
Patient presents for her Annual Exam today. Pt states she has no concerns today. LMP: 09/15/17 Contraception : Depo Last Depo: 06/18/17- injection Due today. Last pap:07/09/2016 WNL  Pt declines STD testing

## 2017-09-17 NOTE — Patient Instructions (Signed)

## 2017-09-17 NOTE — Progress Notes (Signed)
Madison Wilkinson is a 22 y.o. 121P1001 female here for a routine annual gynecologic exam.  Current complaints: none.       Gynecologic History Patient's last menstrual period was 09/15/2016 (exact date). Contraception: Depo-Provera injections Last Pap: 2017. Results were: normal Last mammogram: NA. Results were: NA  Obstetric History OB History  Gravida Para Term Preterm AB Living  1 1 1     1   SAB TAB Ectopic Multiple Live Births        0 1    # Outcome Date GA Lbr Len/2nd Weight Sex Delivery Anes PTL Lv  1 Term 01/29/17 10139w1d 05:44 / 00:55 8 lb 10.1 oz (3.915 kg) M Vag-Spont EPI  LIV      Past Medical History:  Diagnosis Date  . Anxiety   . Asthma     Past Surgical History:  Procedure Laterality Date  . NO PAST SURGERIES      Current Outpatient Medications on File Prior to Visit  Medication Sig Dispense Refill  . acetaminophen (TYLENOL) 325 MG tablet Take 650 mg by mouth every 6 (six) hours as needed.    . medroxyPROGESTERone (DEPO-PROVERA) 150 MG/ML injection Inject 1 mL (150 mg total) into the muscle every 3 (three) months. 1 mL 4  . hydrochlorothiazide (HYDRODIURIL) 25 MG tablet Take 1 tablet (25 mg total) by mouth daily. (Patient not taking: Reported on 06/18/2017) 30 tablet 2  . Prenatal MV-Min-FA-Omega-3 (PRENATAL GUMMIES/DHA & FA) 0.4-32.5 MG CHEW Chew 2 tablets by mouth daily.      No current facility-administered medications on file prior to visit.     No Known Allergies  Social History   Socioeconomic History  . Marital status: Single    Spouse name: Not on file  . Number of children: Not on file  . Years of education: Not on file  . Highest education level: Not on file  Social Needs  . Financial resource strain: Not on file  . Food insecurity - worry: Not on file  . Food insecurity - inability: Not on file  . Transportation needs - medical: Not on file  . Transportation needs - non-medical: Not on file  Occupational History  . Not on file  Tobacco  Use  . Smoking status: Current Every Day Smoker    Packs/day: 1.00  . Smokeless tobacco: Never Used  Substance and Sexual Activity  . Alcohol use: No  . Drug use: No  . Sexual activity: Yes    Partners: Male    Birth control/protection: Injection  Other Topics Concern  . Not on file  Social History Narrative  . Not on file    Family History  Problem Relation Age of Onset  . Cancer Paternal Grandfather   . Diabetes Paternal Grandfather   . Heart attack Paternal Grandfather     The following portions of the patient's history were reviewed and updated as appropriate: allergies, current medications, past family history, past medical history, past social history, past surgical history and problem list.  Review of Systems Pertinent items are noted in HPI.   Objective:  BP 109/77   Pulse 87   Ht 5\' 2"  (1.575 m)   Wt 200 lb (90.7 kg)   LMP 09/15/2016 (Exact Date)   Breastfeeding? No   BMI 36.58 kg/m  CONSTITUTIONAL: Well-developed, well-nourished female in no acute distress.  HENT:  Normocephalic, atraumatic, External right and left ear normal. Oropharynx is clear and moist EYES: Conjunctivae and EOM are normal. Pupils are equal, round, and  reactive to light. No scleral icterus.  NECK: Normal range of motion, supple, no masses.  Normal thyroid.  SKIN: Skin is warm and dry. No rash noted. Not diaphoretic. No erythema. No pallor. NEUROLGIC: Alert and oriented to person, place, and time. Normal reflexes, muscle tone coordination. No cranial nerve deficit noted. PSYCHIATRIC: Normal mood and affect. Normal behavior. Normal judgment and thought content. CARDIOVASCULAR: Normal heart rate noted, regular rhythm RESPIRATORY: Clear to auscultation bilaterally. Effort and breath sounds normal, no problems with respiration noted. BREASTS: Symmetric in size. No masses, skin changes, nipple drainage, or lymphadenopathy. ABDOMEN: Soft, normal bowel sounds, no distention noted.  No tenderness,  rebound or guarding.  PELVIC: Normal appearing external genitalia; normal appearing vaginal mucosa and cervix.  No abnormal discharge noted.  Pap smear obtained.  Normal uterine size, no other palpable masses, no uterine or adnexal tenderness. MUSCULOSKELETAL: Normal range of motion. No tenderness.  No cyanosis, clubbing, or edema.  2+ distal pulses.   Assessment:  Annual gynecologic examination with pap smear Contraceptive management Plan:  Will follow up results of pap smear and manage accordingly. Continue with Depo Provera q 3 months  Routine preventative health maintenance measures emphasized. Please refer to After Visit Summary for other counseling recommendations.    Hermina Staggers, MD, FACOG Attending Obstetrician & Gynecologist Center for [facility], [facility]

## 2017-09-22 LAB — CYTOLOGY - PAP
CHLAMYDIA, DNA PROBE: NEGATIVE
Diagnosis: NEGATIVE
Neisseria Gonorrhea: NEGATIVE

## 2017-12-10 ENCOUNTER — Ambulatory Visit: Payer: Medicaid Other

## 2017-12-11 ENCOUNTER — Ambulatory Visit (INDEPENDENT_AMBULATORY_CARE_PROVIDER_SITE_OTHER): Payer: Medicaid Other

## 2017-12-11 DIAGNOSIS — Z309 Encounter for contraceptive management, unspecified: Secondary | ICD-10-CM | POA: Diagnosis not present

## 2017-12-11 DIAGNOSIS — Z3042 Encounter for surveillance of injectable contraceptive: Secondary | ICD-10-CM

## 2017-12-11 MED ORDER — MEDROXYPROGESTERONE ACETATE 150 MG/ML IM SUSP
150.0000 mg | Freq: Once | INTRAMUSCULAR | Status: AC
  Administered 2017-12-11: 150 mg via INTRAMUSCULAR

## 2017-12-11 NOTE — Progress Notes (Signed)
Nurse visit for pt supplied Depo given L Del w/o difficulty. Pt on time for injection. Pap is UTD.   Administrations This Visit    medroxyPROGESTERone (DEPO-PROVERA) injection 150 mg    Admin Date 12/11/2017 Action Given Dose 150 mg Route Intramuscular Administered By Lewayne BuntingGardner, Ronin Rehfeldt, CMA

## 2018-03-02 ENCOUNTER — Ambulatory Visit: Payer: Medicaid Other

## 2018-03-02 ENCOUNTER — Ambulatory Visit (INDEPENDENT_AMBULATORY_CARE_PROVIDER_SITE_OTHER): Payer: Medicaid Other | Admitting: *Deleted

## 2018-03-02 VITALS — BP 130/88 | HR 109 | Wt 208.0 lb

## 2018-03-02 DIAGNOSIS — Z3042 Encounter for surveillance of injectable contraceptive: Secondary | ICD-10-CM | POA: Diagnosis not present

## 2018-03-02 MED ORDER — MEDROXYPROGESTERONE ACETATE 150 MG/ML IM SUSP
150.0000 mg | Freq: Once | INTRAMUSCULAR | Status: AC
  Administered 2018-03-02: 150 mg via INTRAMUSCULAR

## 2018-03-02 NOTE — Progress Notes (Signed)
Pt is in office for depo injection. Pt is on time for injection. Pt supplied depo for today's visit. Pt tolerated well and has no other concerns.  Pt advised to RTO 05/24/18 for next depo.   BP 130/88   Pulse (!) 109   Wt 208 lb (94.3 kg)   BMI 38.04 kg/m   Administrations This Visit    medroxyPROGESTERone (DEPO-PROVERA) injection 150 mg    Admin Date 03/02/2018 Action Given Dose 150 mg Route Intramuscular Administered By Lanney GinsFoster, Hollace Michelli D, CMA

## 2018-03-03 NOTE — Progress Notes (Signed)
I have reviewed the chart and agree with nursing staff's documentation of this patient's encounter.  Roe CoombsRachelle A Rhyli Depaula, CNM 03/03/2018 8:05 AM

## 2018-05-25 ENCOUNTER — Ambulatory Visit (INDEPENDENT_AMBULATORY_CARE_PROVIDER_SITE_OTHER): Payer: Medicaid Other

## 2018-05-25 ENCOUNTER — Other Ambulatory Visit: Payer: Self-pay

## 2018-05-25 VITALS — Wt 208.3 lb

## 2018-05-25 DIAGNOSIS — Z3042 Encounter for surveillance of injectable contraceptive: Secondary | ICD-10-CM

## 2018-05-25 DIAGNOSIS — Z30013 Encounter for initial prescription of injectable contraceptive: Secondary | ICD-10-CM

## 2018-05-25 MED ORDER — MEDROXYPROGESTERONE ACETATE 150 MG/ML IM SUSP: 150.0000 mg | INTRAMUSCULAR | 1 refills | Status: DC

## 2018-05-25 MED ORDER — MEDROXYPROGESTERONE ACETATE 150 MG/ML IM SUSP
150.0000 mg | INTRAMUSCULAR | Status: AC
  Administered 2018-05-25 – 2020-09-20 (×7): 150 mg via INTRAMUSCULAR

## 2018-05-25 NOTE — Progress Notes (Signed)
Patient is in the office for depo injection, administered R del, and pt tolerated well .Marland Kitchen. Administrations This Visit    medroxyPROGESTERone (DEPO-PROVERA) injection 150 mg    Admin Date 05/25/2018 Action Given Dose 150 mg Route Intramuscular Administered By Katrina StackStalling, Brittany D, RN        Next depo due Dec 2- Dec16.

## 2018-05-25 NOTE — Progress Notes (Signed)
Agree with A & P. 

## 2018-05-25 NOTE — Progress Notes (Signed)
Pt has appt for Depo injection  Needed Depo refilled  AEX was 09/17/2017 Rx sent  Pt made aware.

## 2018-08-17 ENCOUNTER — Ambulatory Visit (INDEPENDENT_AMBULATORY_CARE_PROVIDER_SITE_OTHER): Payer: Medicaid Other | Admitting: *Deleted

## 2018-08-17 VITALS — BP 132/90 | HR 98 | Wt 207.0 lb

## 2018-08-17 DIAGNOSIS — Z3042 Encounter for surveillance of injectable contraceptive: Secondary | ICD-10-CM

## 2018-08-17 DIAGNOSIS — Z30013 Encounter for initial prescription of injectable contraceptive: Secondary | ICD-10-CM

## 2018-08-17 MED ORDER — MEDROXYPROGESTERONE ACETATE 150 MG/ML IM SUSP
150.0000 mg | INTRAMUSCULAR | 0 refills | Status: DC
Start: 2018-08-17 — End: 2018-11-23

## 2018-08-17 NOTE — Progress Notes (Signed)
Pt is in office for depo injection. Pt is on time for Depo. Injection given, pt tolerated well. Pt advised to have AEX with next depo injection. Will send one time refill to pharmacy today, continue Rx after annual.  Pt has no other concerns today.  BP 132/90   Pulse 98   Wt 207 lb (93.9 kg)   BMI 37.86 kg/m      Administrations This Visit    medroxyPROGESTERone (DEPO-PROVERA) injection 150 mg    Admin Date 08/17/2018 Action Given Dose 150 mg Route Intramuscular Administered By Lanney GinsFoster, Payden Docter D, CMA

## 2018-09-05 IMAGING — US US MFM OB FOLLOW-UP
1 series · 13 of 28 positions shown · non-contrast
Comparison: none

[Series 1: us mfm ob follow-up · 13 of 50 slices shown]
[im 2/50]
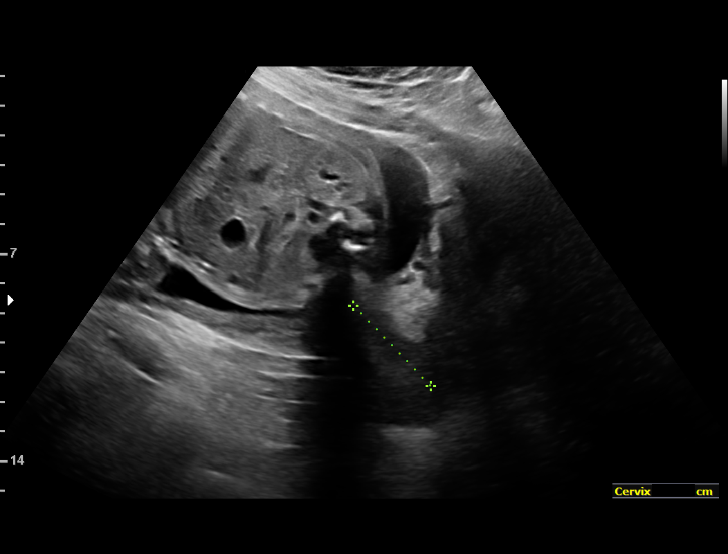
[im 6/50]
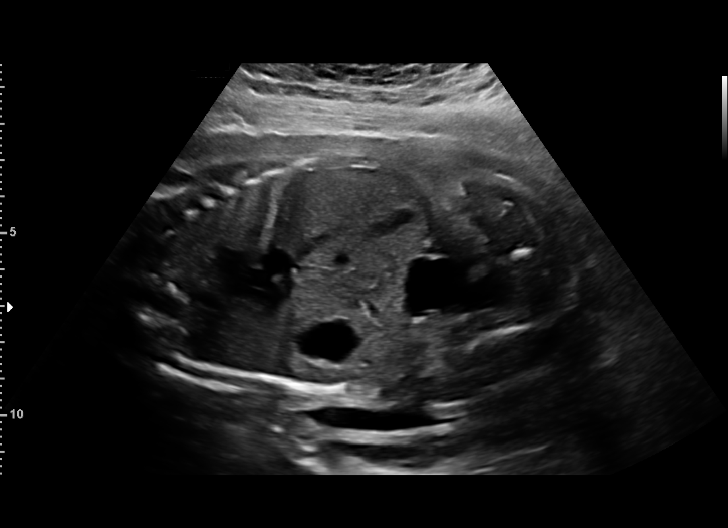
[im 10/50]
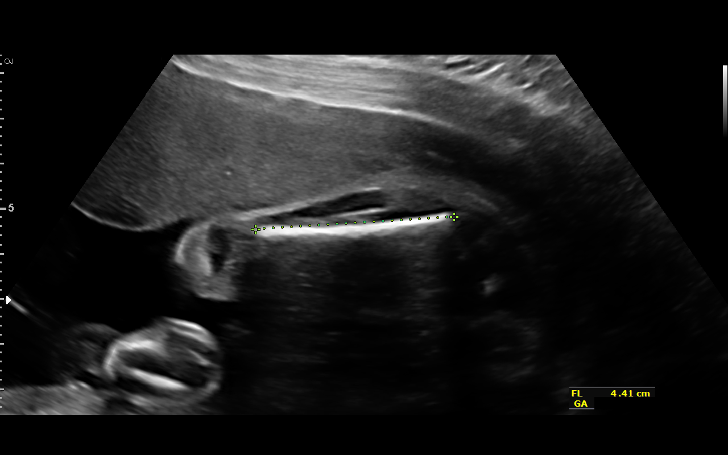
[im 13/50]
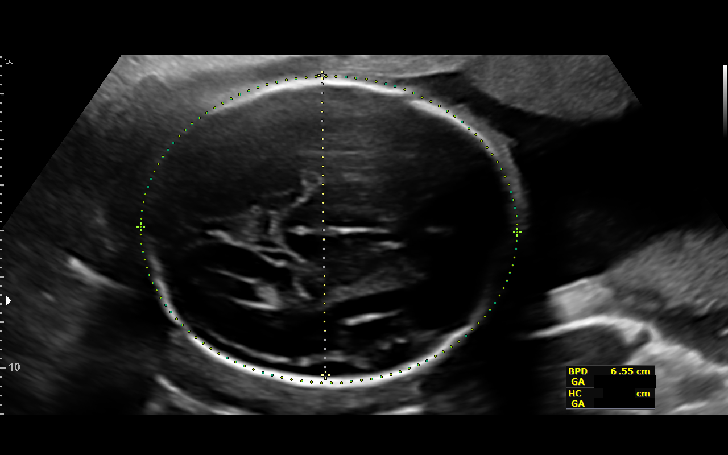
[im 17/50]
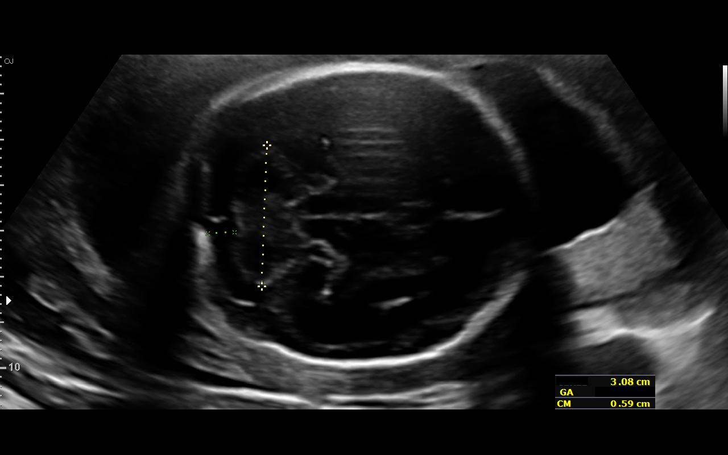
[im 20/50]
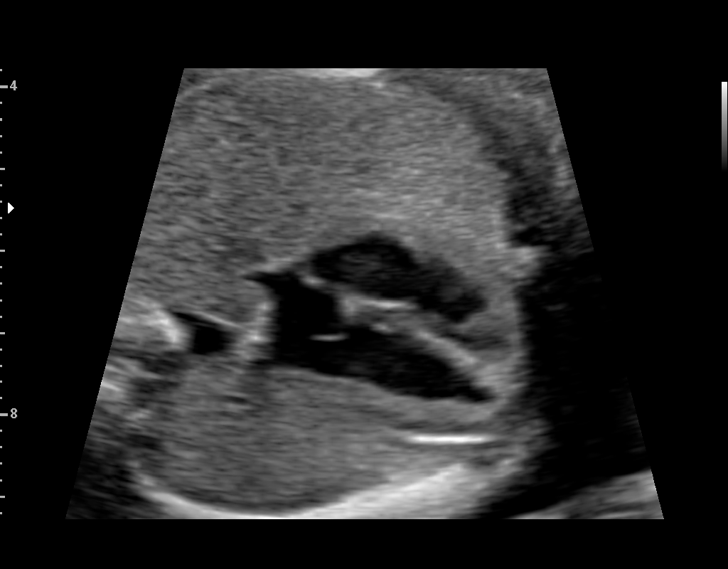
[im 26/50]
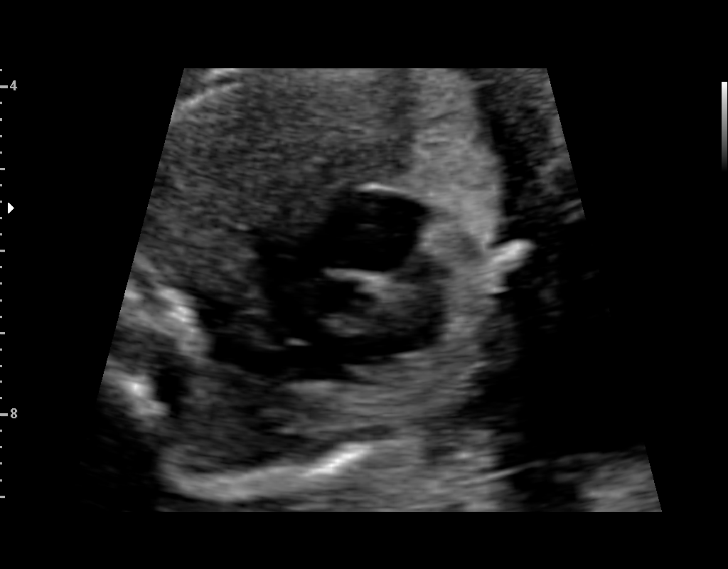
[im 30/50]
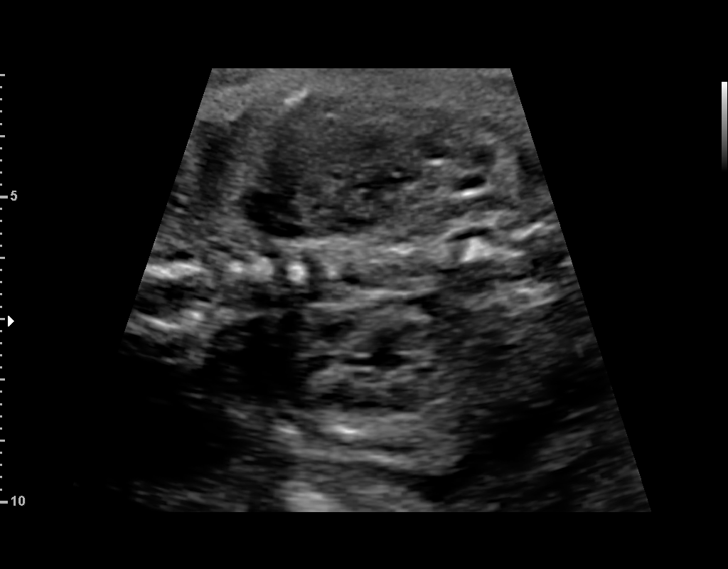
[im 33/50]
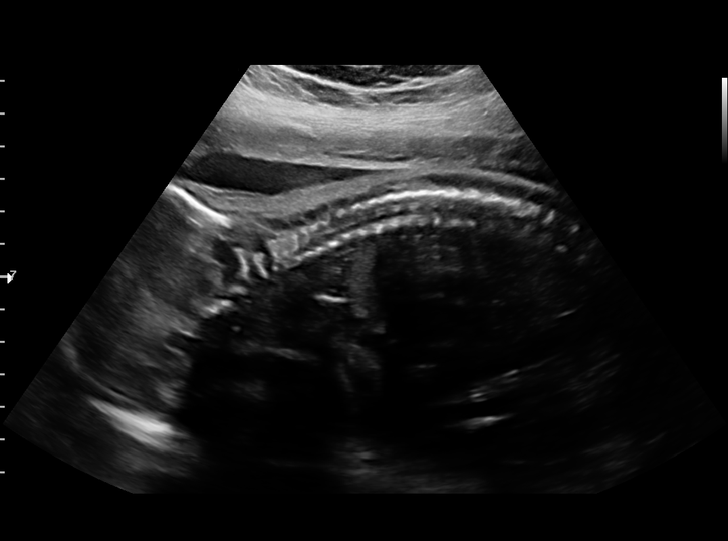
[im 37/50]
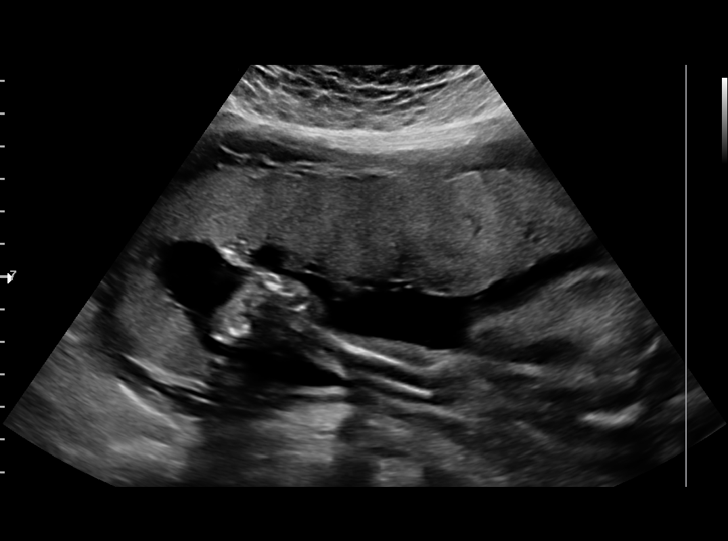
[im 40/50]
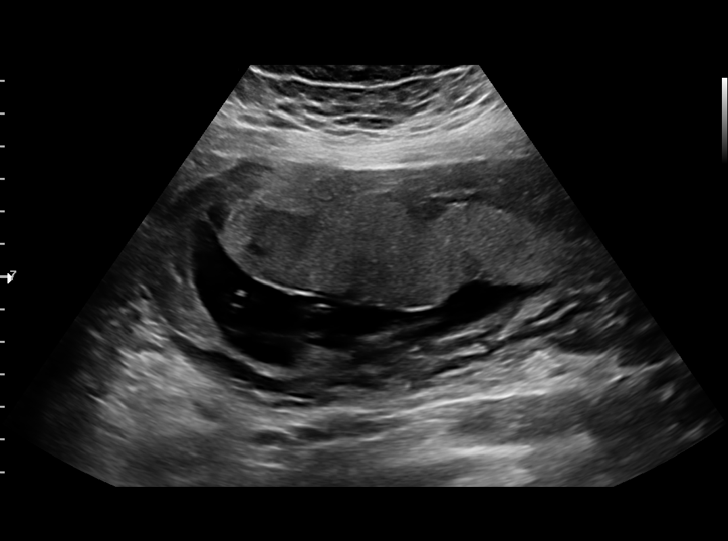
[im 44/50]
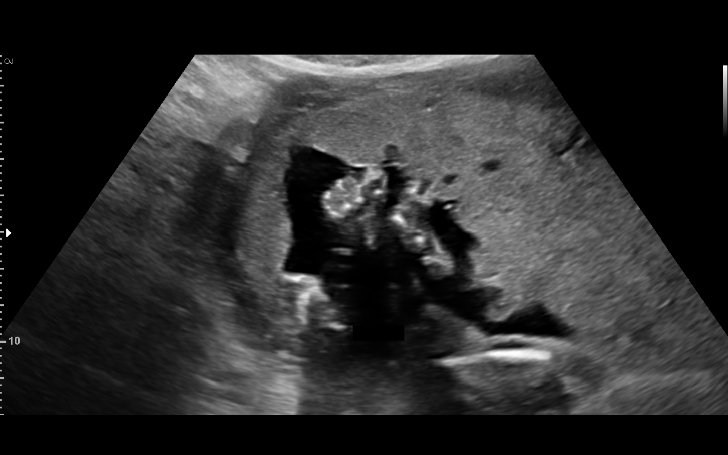
[im 48/50]
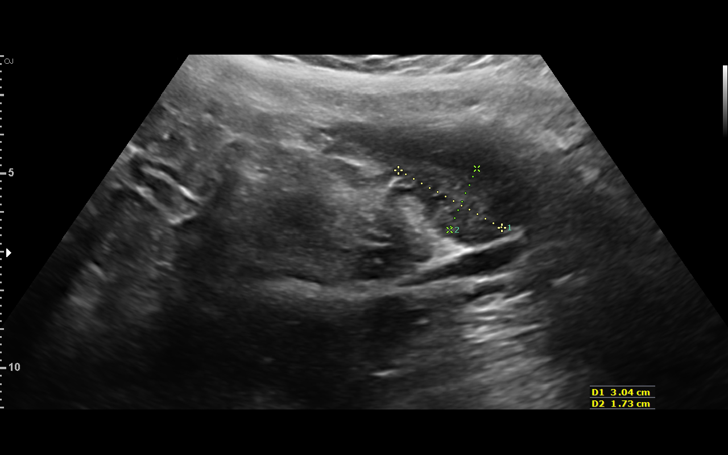

[13 of 28 positions shown; findings below may reference images not displayed]

Name:       RENAY RAMIRE                  Visit Date: 10/17/2016 [REDACTED]

1  JUAN MIGUEL CHONG            [id number]      [id number]     [id number]
Indications

26 weeks gestation of pregnancy
Encounter for other antenatal screening
follow-up
OB History

Gravidity:    1         Term:   0        Prem:   0        SAB:   0
TOP:          0       Ectopic:  0        Living: 0
Fetal Evaluation

Num Of Fetuses:     1
Fetal Heart         150
Rate(bpm):
Cardiac Activity:   Observed
Presentation:       Breech
Placenta:           Anterior, above cervical os
P. Cord Insertion:  Visualized

Amniotic Fluid
AFI FV:      Subjectively within normal limits

Largest Pocket(cm)
4.2
Biometry

BPD:      65.8  mm     G. Age:  26w 4d         49  %    CI:        79.23   %    70 - 86
FL/HC:      19.6   %    18.6 -
HC:      233.7  mm     G. Age:  25w 3d          7  %    HC/AC:      1.05        1.04 -
AC:      221.6  mm     G. Age:  26w 4d         51  %    FL/BPD:     69.5   %    71 - 87
FL:       45.7  mm     G. Age:  25w 1d         10  %    FL/AC:      20.6   %    20 - 24
HUM:      40.9  mm     G. Age:  24w 6d          9  %
CER:      30.8  mm     G. Age:  26w 6d         65  %

CM:        5.9  mm

Est. FW:     875  gm    1 lb 15 oz      47  %
Gestational Age

LMP:           29w 4d        Date:  [DATE]                 EDD:   03/24/16
U/S Today:     26w 0d                                        EDD:   12/29/16
Best:          26w 2d     Det. By:  Early Ultrasound         EDD:   01/23/17
(01/21/17)
Anatomy

Cranium:               Appears normal         Aortic Arch:            Previously seen
Cavum:                 Appears normal         Ductal Arch:            Not well visualized
Ventricles:            Appears normal         Diaphragm:              Appears normal
Choroid Plexus:        Previously seen        Stomach:                Appears normal, left
sided
Cerebellum:            Appears normal         Abdomen:                Appears normal
Posterior Fossa:       Appears normal         Abdominal Wall:         Not well visualized
Nuchal Fold:           Not applicable (>20    Cord Vessels:           Appears normal (3
wks GA)                                        vessel cord)
Face:                  Orbits nl; profile not Kidneys:                Appear normal
well visualized
Lips:                  Not well visualized    Bladder:                Appears normal
Thoracic:              Appears normal         Spine:                  Previously seen
Heart:                 Appears normal         Upper Extremities:      Previously seen
(4CH, axis, and situs
RVOT:                  Appears normal         Lower Extremities:      Previously seen
LVOT:                  Appears normal

Other:  Fetus appears to be a male.
Cervix Uterus Adnexa

Cervix
Length:           3.76  cm.
Normal appearance by transabdominal scan.

Uterus
No abnormality visualized.

Left Ovary
Size(cm)        3   x   1.7    x  1.9       Vol(ml):
Within normal limits.

Right Ovary
Size(cm)       1.9  x   1.7    x  1.9       Vol(ml):
Within normal limits.
Impression

Single IUP at 26w 2d
Limited view of the fetal lips / cord insertion obtained
The remainder of the fetal anatomy appears normal
Fetal growth is appropriate (47th %tile)
Anterior placenta without previa
Normal amniotic fluid volume
Recommendations

Second attempt to complete anatomy - consider repeat
ultrasound to reevaluate the fetal lips / cord insertion in 4-6
weeks
Otherwise, follow up ultrasounds as clinically indicated

## 2018-11-09 ENCOUNTER — Ambulatory Visit (INDEPENDENT_AMBULATORY_CARE_PROVIDER_SITE_OTHER): Payer: Medicaid Other

## 2018-11-09 DIAGNOSIS — Z3042 Encounter for surveillance of injectable contraceptive: Secondary | ICD-10-CM | POA: Diagnosis not present

## 2018-11-09 NOTE — Progress Notes (Signed)
I have reviewed the chart and agree with nursing staff's documentation of this patient's encounter.  Catalina Antigua, MD 11/09/2018 4:12 PM

## 2018-11-09 NOTE — Progress Notes (Signed)
Patient is in the office for depo, administered in R del, and pt tolerated well. Next depo due 5/18-6/1. Pt has annual scheduled for 11-23-18. .. Administrations This Visit    medroxyPROGESTERone (DEPO-PROVERA) injection 150 mg    Admin Date 11/09/2018 Action Given Dose 150 mg Route Intramuscular Administered By Katrina Stack, RN

## 2018-11-23 ENCOUNTER — Other Ambulatory Visit: Payer: Self-pay

## 2018-11-23 ENCOUNTER — Ambulatory Visit: Payer: Medicaid Other | Admitting: Advanced Practice Midwife

## 2018-11-23 ENCOUNTER — Encounter: Payer: Self-pay | Admitting: Advanced Practice Midwife

## 2018-11-23 VITALS — BP 124/86 | HR 94 | Ht 64.0 in | Wt 200.0 lb

## 2018-11-23 DIAGNOSIS — Z3042 Encounter for surveillance of injectable contraceptive: Secondary | ICD-10-CM | POA: Insufficient documentation

## 2018-11-23 DIAGNOSIS — Z30013 Encounter for initial prescription of injectable contraceptive: Secondary | ICD-10-CM

## 2018-11-23 DIAGNOSIS — Z3009 Encounter for other general counseling and advice on contraception: Secondary | ICD-10-CM

## 2018-11-23 DIAGNOSIS — Z0000 Encounter for general adult medical examination without abnormal findings: Secondary | ICD-10-CM

## 2018-11-23 MED ORDER — MEDROXYPROGESTERONE ACETATE 150 MG/ML IM SUSP: 150.0000 mg | INTRAMUSCULAR | 4 refills | Status: DC

## 2018-11-23 NOTE — Progress Notes (Signed)
Subjective:     Madison Wilkinson is a 23 y.o. female here for a routine exam.  Current complaints: none.  Personal health questionnaire reviewed: yes.   Gynecologic History No LMP recorded. Patient has had an injection. Contraception: Depo-Provera injections Last Pap: 09/17/2017. Results were: normal   Obstetric History OB History  Gravida Para Term Preterm AB Living  1 1 1     1   SAB TAB Ectopic Multiple Live Births        0 1    # Outcome Date GA Lbr Len/2nd Weight Sex Delivery Anes PTL Lv  1 Term 01/29/17 [redacted]w[redacted]d 05:44 / 00:55 3915 g M Vag-Spont EPI  LIV     The following portions of the patient's history were reviewed and updated as appropriate: allergies, current medications, past family history, past medical history, past social history, past surgical history and problem list.  Review of Systems A comprehensive review of systems was negative.    Objective:   BP 124/86   Pulse 94   Ht 5\' 4"  (1.626 m)   Wt 90.7 kg   BMI 34.33 kg/m   VS reviewed, nursing note reviewed,  Constitutional: well developed, well nourished, no distress HEENT: normocephalic CV: normal rate HEART: normal rate, heart sounds, regular rhythm RESP: normal effort, lung sounds clear and equal bilaterally Breast Exam:  right breast normal without mass, skin or nipple changes or axillary nodes, left breast normal without mass, skin or nipple changes or axillary nodes Abdomen: soft Neuro: alert and oriented x 3 Skin: warm, dry Psych: affect normal Pelvic exam: Deferred      Assessment/Plan:   1. Encounter for counseling regarding contraception Discussed LARCs as most effective forms of birth control.  Discussed benefits/risks of other methods.  Pt desires to continue Depo. Rx for Depo sent to pharmacy.  2. Well woman exam (no gynecological exam) --Reviewed normal Pap results from last year, recommend annual exam including breast exam, Pap every 3 years per guidelines.  3. Encounter for initial  prescription of injectable contraceptive - medroxyPROGESTERone (DEPO-PROVERA) 150 MG/ML injection; Inject 1 mL (150 mg total) into the muscle every 3 (three) months.  Dispense: 1 mL; Refill: 4   Follow up in: 1 year or as needed.   Sharen Counter, CNM 2:06 PM

## 2018-11-23 NOTE — Patient Instructions (Signed)

## 2019-02-08 ENCOUNTER — Ambulatory Visit: Payer: Medicaid Other

## 2019-02-09 ENCOUNTER — Other Ambulatory Visit: Payer: Self-pay

## 2019-02-09 ENCOUNTER — Ambulatory Visit (INDEPENDENT_AMBULATORY_CARE_PROVIDER_SITE_OTHER): Payer: Medicaid Other

## 2019-02-09 VITALS — BP 120/81 | HR 65 | Ht 65.0 in | Wt 208.0 lb

## 2019-02-09 DIAGNOSIS — Z3042 Encounter for surveillance of injectable contraceptive: Secondary | ICD-10-CM | POA: Diagnosis not present

## 2019-02-09 MED ORDER — MEDROXYPROGESTERONE ACETATE 150 MG/ML IM SUSP
150.0000 mg | Freq: Once | INTRAMUSCULAR | Status: AC
  Administered 2019-02-09: 150 mg via INTRAMUSCULAR

## 2019-02-09 NOTE — Progress Notes (Signed)
Presented for DEPO injection, given in LD, tolerated well.  Next DEPO August 18- Sept. 09/2018  Administrations This Visit    medroxyPROGESTERone (DEPO-PROVERA) injection 150 mg    Admin Date 02/09/2019 Action Given Dose 150 mg Route Intramuscular Administered By Maretta Bees, RMA

## 2019-04-29 ENCOUNTER — Other Ambulatory Visit: Payer: Self-pay

## 2019-04-29 ENCOUNTER — Ambulatory Visit (INDEPENDENT_AMBULATORY_CARE_PROVIDER_SITE_OTHER): Payer: Medicaid Other | Admitting: *Deleted

## 2019-04-29 DIAGNOSIS — Z3042 Encounter for surveillance of injectable contraceptive: Secondary | ICD-10-CM | POA: Diagnosis not present

## 2019-04-29 NOTE — Progress Notes (Signed)
Pt is in office for depo injection.  Pt is on time for injeciton. Injection given, pt tolerated well.  Pt advised to RTO 11/5-11/18/2020 for next depo.  Pt has no other concerns today.     Administrations This Visit    medroxyPROGESTERone (DEPO-PROVERA) injection 150 mg    Admin Date 04/29/2019 Action Given Dose 150 mg Route Intramuscular Administered By Valene Bors, CMA

## 2019-04-29 NOTE — Progress Notes (Signed)
I agree with the nurses note and documentation.    Lezlie Lye, NP 04/29/2019 4:05 PM

## 2019-07-22 ENCOUNTER — Ambulatory Visit: Payer: Medicaid Other

## 2019-07-28 ENCOUNTER — Ambulatory Visit (INDEPENDENT_AMBULATORY_CARE_PROVIDER_SITE_OTHER): Payer: Medicaid Other | Admitting: *Deleted

## 2019-07-28 ENCOUNTER — Other Ambulatory Visit: Payer: Self-pay

## 2019-07-28 DIAGNOSIS — Z3042 Encounter for surveillance of injectable contraceptive: Secondary | ICD-10-CM

## 2019-07-28 NOTE — Progress Notes (Signed)
Pt is in office for Depo injection. Pt is on time for her injection. Depo given, pt tolerated well.    Pt has no other concerns today.   Pt advised to RTO 2/3-2/17/2021 for next Depo.  Administrations This Visit    medroxyPROGESTERone (DEPO-PROVERA) injection 150 mg    Admin Date 07/28/2019 Action Given Dose 150 mg Route Intramuscular Administered By Valene Bors, CMA

## 2019-10-20 ENCOUNTER — Other Ambulatory Visit: Payer: Self-pay

## 2019-10-20 ENCOUNTER — Ambulatory Visit (INDEPENDENT_AMBULATORY_CARE_PROVIDER_SITE_OTHER): Payer: Medicaid Other

## 2019-10-20 DIAGNOSIS — Z3042 Encounter for surveillance of injectable contraceptive: Secondary | ICD-10-CM | POA: Diagnosis not present

## 2019-10-20 MED ORDER — MEDROXYPROGESTERONE ACETATE 150 MG/ML IM SUSP
150.0000 mg | Freq: Once | INTRAMUSCULAR | Status: AC
  Administered 2019-10-20: 15:00:00 150 mg via INTRAMUSCULAR

## 2019-10-20 NOTE — Progress Notes (Signed)
Pt is here for a depo injections. Pt tolerated injection well in the RD without complication. Pt advised to make next appointment between 4/28 - 5/12. -EH/RMA

## 2020-01-12 ENCOUNTER — Other Ambulatory Visit: Payer: Self-pay

## 2020-01-12 MED ORDER — MEDROXYPROGESTERONE ACETATE 150 MG/ML IM SUSP: 150.0000 mg | INTRAMUSCULAR | 0 refills | Status: DC

## 2020-01-12 NOTE — Progress Notes (Signed)
RX for 1 dose sent to [facility].  Patient Needs Annual Exam before next DEPO.

## 2020-01-13 ENCOUNTER — Ambulatory Visit (INDEPENDENT_AMBULATORY_CARE_PROVIDER_SITE_OTHER): Payer: Medicaid Other

## 2020-01-13 ENCOUNTER — Other Ambulatory Visit: Payer: Self-pay

## 2020-01-13 DIAGNOSIS — Z30013 Encounter for initial prescription of injectable contraceptive: Secondary | ICD-10-CM | POA: Diagnosis not present

## 2020-01-13 MED ORDER — MEDROXYPROGESTERONE ACETATE 150 MG/ML IM SUSP
150.0000 mg | Freq: Once | INTRAMUSCULAR | Status: AC
  Administered 2020-01-13: 150 mg via INTRAMUSCULAR

## 2020-01-13 NOTE — Progress Notes (Signed)
SUBJECTIVE 24 year old presents for DEPO Injection.  DEPO given in LD, tolerated well.  Patient needs an Annual Exam before next DEPO.  Next DEPO July 22 - April 13, 2020  Administrations This Visit    medroxyPROGESTERone (DEPO-PROVERA) injection 150 mg    Admin Date 01/13/2020 Action Given Dose 150 mg Route Intramuscular Administered By Maretta Bees, RMA

## 2020-01-17 NOTE — Progress Notes (Signed)
Patient ID: Montel Clock, female   DOB: Apr 17, 1996, 24 y.o.   MRN: [id number] Patient seen and assessed by nursing staff during this encounter. I have reviewed the chart and agree with the documentation and plan. I have also made any necessary editorial changes.  Scheryl Darter, MD 01/17/2020 11:02 AM

## 2020-04-05 ENCOUNTER — Ambulatory Visit (INDEPENDENT_AMBULATORY_CARE_PROVIDER_SITE_OTHER): Payer: Medicaid Other | Admitting: Advanced Practice Midwife

## 2020-04-05 ENCOUNTER — Other Ambulatory Visit: Payer: Self-pay

## 2020-04-05 ENCOUNTER — Encounter: Payer: Self-pay | Admitting: Advanced Practice Midwife

## 2020-04-05 VITALS — BP 115/76 | HR 64 | Ht 64.0 in | Wt 212.0 lb

## 2020-04-05 DIAGNOSIS — Z01419 Encounter for gynecological examination (general) (routine) without abnormal findings: Secondary | ICD-10-CM | POA: Diagnosis not present

## 2020-04-05 DIAGNOSIS — Z3042 Encounter for surveillance of injectable contraceptive: Secondary | ICD-10-CM

## 2020-04-05 DIAGNOSIS — Z0000 Encounter for general adult medical examination without abnormal findings: Secondary | ICD-10-CM

## 2020-04-05 DIAGNOSIS — Z3009 Encounter for other general counseling and advice on contraception: Secondary | ICD-10-CM

## 2020-04-05 MED ORDER — MEDROXYPROGESTERONE ACETATE 150 MG/ML IM SUSP
150.0000 mg | Freq: Once | INTRAMUSCULAR | Status: AC
  Administered 2020-04-05: 16:00:00 150 mg via INTRAMUSCULAR

## 2020-04-05 MED ORDER — MEDROXYPROGESTERONE ACETATE 150 MG/ML IM SUSP: 150.0000 mg | INTRAMUSCULAR | 3 refills | Status: AC

## 2020-04-05 NOTE — Progress Notes (Signed)
Subjective:     Madison Wilkinson is a 24 y.o. female here at [facility] for a routine exam.  Current complaints: None.  No menses with Depo. Is happy with Depo for contraception.  Family hx breast cancer, maternal and paternal grandmother.  Do you have a primary care provider? No   Gynecologic History No LMP recorded. Patient has had an injection. Contraception: Depo-Provera injections Last Pap: 09/10/2017. Results were: normal Last mammogram: n/a.    Obstetric History OB History  Gravida Para Term Preterm AB Living  1 1 1     1   SAB TAB Ectopic Multiple Live Births        0 1    # Outcome Date GA Lbr Len/2nd Weight Sex Delivery Anes PTL Lv  1 Term 01/29/17 [redacted]w[redacted]d 05:44 / 00:55 8 lb 10.1 oz (3.915 kg) M Vag-Spont EPI  LIV     The following portions of the patient's history were reviewed and updated as appropriate: allergies, current medications, past family history, past medical history, past social history, past surgical history and problem list.  Review of Systems Pertinent items noted in HPI and remainder of comprehensive ROS otherwise negative.    Objective:   BP 115/76   Pulse 64   Ht 5\' 4"  (1.626 m)   Wt (!) 212 lb (96.2 kg)   BMI 36.39 kg/m  VS reviewed, nursing note reviewed,  Constitutional: well developed, well nourished, no distress HEENT: normocephalic CV: normal rate Pulm/chest wall: normal effort Breast Exam:  right breast normal without mass, skin or nipple changes or axillary nodes, left breast normal without mass, skin or nipple changes or axillary nodes Abdomen: soft Neuro: alert and oriented x 3 Skin: warm, dry Psych: affect normal Pelvic exam: Deferred     Assessment/Plan:   1. Encounter for counseling regarding contraception --Discussed other methods, pt happy with Depo.  Injection given today, with Rx for future injections. - medroxyPROGESTERone (DEPO-PROVERA) 150 MG/ML injection; Inject 1 mL (150 mg total) into the muscle every 3 (three)  months.  Dispense: 1 mL; Refill: 3 - medroxyPROGESTERone (DEPO-PROVERA) injection 150 mg   2. Well woman exam without gynecological exam --Doing well, no complications --Pap in 2022   Follow up in: 1 year or as needed.   , CNM 3:36 PM

## 2020-04-05 NOTE — Progress Notes (Addendum)
GYN presents for AEX/DEPO.  Needs refills on DEPO.  Office supplied DEPO given in RD, tolerated well.  Next DEPO  Oct. 13-27, 2021  Administrations This Visit    medroxyPROGESTERone (DEPO-PROVERA) injection 150 mg    Admin Date 04/05/2020 Action Given Dose 150 mg Route Intramuscular Administered By Maretta Bees, RMA

## 2020-06-28 ENCOUNTER — Other Ambulatory Visit: Payer: Self-pay

## 2020-06-28 ENCOUNTER — Ambulatory Visit (INDEPENDENT_AMBULATORY_CARE_PROVIDER_SITE_OTHER): Payer: Medicaid Other

## 2020-06-28 DIAGNOSIS — Z3042 Encounter for surveillance of injectable contraceptive: Secondary | ICD-10-CM | POA: Diagnosis not present

## 2020-06-28 NOTE — Progress Notes (Signed)
Patient was assessed and managed by nursing staff during this encounter. I have reviewed the chart and agree with the documentation and plan. I have also made any necessary editorial changes.  Jaterrius Ricketson A Randolf Sansoucie, MD 06/28/2020 4:17 PM   

## 2020-06-28 NOTE — Progress Notes (Addendum)
Nurse visit for pt supply Depo  Pt is on time for inj Depo given LD without difficulty Next Depo due Jan 5-19, pt agrees  Pap due 2022   .Marland Kitchen Administrations This Visit    medroxyPROGESTERone (DEPO-PROVERA) injection 150 mg    Admin Date 06/28/2020 Action Given Dose 150 mg Route Intramuscular Administered By Lewayne Bunting, CMA

## 2020-09-20 ENCOUNTER — Other Ambulatory Visit: Payer: Self-pay

## 2020-09-20 ENCOUNTER — Ambulatory Visit (INDEPENDENT_AMBULATORY_CARE_PROVIDER_SITE_OTHER): Payer: Medicaid Other

## 2020-09-20 DIAGNOSIS — Z3042 Encounter for surveillance of injectable contraceptive: Secondary | ICD-10-CM | POA: Diagnosis not present

## 2020-09-20 NOTE — Progress Notes (Signed)
Patient was assessed and managed by nursing staff during this encounter. I have reviewed the chart and agree with the documentation and plan. I have also made any necessary editorial changes.  Lawrence A Bass, MD 09/20/2020 4:33 PM   

## 2020-09-20 NOTE — Progress Notes (Signed)
Pt is in the office for depo injection, administered in R Del and pt tolerated well. Next due March 30- April 13 .Marland Kitchen Administrations This Visit    medroxyPROGESTERone (DEPO-PROVERA) injection 150 mg    Admin Date 09/20/2020 Action Given Dose 150 mg Route Intramuscular Administered By Katrina Stack, RN

## 2020-12-19 ENCOUNTER — Other Ambulatory Visit: Payer: Self-pay

## 2020-12-19 ENCOUNTER — Ambulatory Visit (INDEPENDENT_AMBULATORY_CARE_PROVIDER_SITE_OTHER): Payer: Medicaid Other

## 2020-12-19 DIAGNOSIS — Z3042 Encounter for surveillance of injectable contraceptive: Secondary | ICD-10-CM

## 2020-12-19 MED ORDER — MEDROXYPROGESTERONE ACETATE 150 MG/ML IM SUSP
150.0000 mg | Freq: Once | INTRAMUSCULAR | Status: AC
  Administered 2020-12-19: 150 mg via INTRAMUSCULAR

## 2020-12-19 NOTE — Progress Notes (Signed)
Patient presents for depo inj. Patient is within her window. Inj given in LD. Patient tolerated well.

## 2020-12-19 NOTE — Progress Notes (Signed)
Patient was assessed and managed by nursing staff during this encounter. I have reviewed the chart and agree with the documentation and plan. I have also made any necessary editorial changes.  Ranon Coven, MD 12/19/2020 3:42 PM    

## 2021-03-13 ENCOUNTER — Ambulatory Visit (INDEPENDENT_AMBULATORY_CARE_PROVIDER_SITE_OTHER): Payer: Medicaid Other

## 2021-03-13 ENCOUNTER — Other Ambulatory Visit: Payer: Self-pay

## 2021-03-13 VITALS — BP 122/78 | HR 114 | Ht 63.0 in | Wt 219.0 lb

## 2021-03-13 DIAGNOSIS — Z3042 Encounter for surveillance of injectable contraceptive: Secondary | ICD-10-CM | POA: Diagnosis not present

## 2021-03-13 MED ORDER — MEDROXYPROGESTERONE ACETATE 150 MG/ML IM SUSP
150.0000 mg | Freq: Once | INTRAMUSCULAR | Status: AC
  Administered 2021-03-13: 13:00:00 150 mg via INTRAMUSCULAR

## 2021-03-13 NOTE — Progress Notes (Signed)
SUBJECTIVE:  25 y.o. female presents for DEPO injection.    ASSESSMENT:  On time for DEPO. Last PAP 09/17/2017. Needs Annual Exam/PAP.   PLAN:  DEPO given in RD, tolerated well. Next DEPO Injection due Sept. 20 - Oct. 4, 2022. Make Appt for AEX/PAP.  Administrations This Visit     medroxyPROGESTERone (DEPO-PROVERA) injection 150 mg     Admin Date 03/13/2021 Action Given Dose 150 mg Route Intramuscular Administered By Maretta Bees, RMA

## 2021-03-13 NOTE — Progress Notes (Signed)
I have reviewed the chart and agree with the nurse's note and plan of care for this visit.   Shigeru Lampert Leftwich-Kirby, CNM 3:51 PM  

## 2021-05-08 ENCOUNTER — Ambulatory Visit: Payer: Medicaid Other | Admitting: Obstetrics and Gynecology

## 2021-06-05 ENCOUNTER — Ambulatory Visit: Payer: Medicaid Other
